# Patient Record
Sex: Male | Born: 1935 | Hispanic: Refuse to answer | Marital: Married
Health system: Southern US, Community
[De-identification: ages and names within clinical notes are randomized; demographics above are authoritative.]

---

## 2004-12-16 ENCOUNTER — Ambulatory Visit: Payer: Self-pay | Admitting: Internal Medicine

## 2005-02-15 ENCOUNTER — Encounter: Payer: Self-pay | Admitting: Internal Medicine

## 2005-02-17 ENCOUNTER — Encounter: Payer: Self-pay | Admitting: Internal Medicine

## 2005-03-20 ENCOUNTER — Encounter: Payer: Self-pay | Admitting: Internal Medicine

## 2005-10-31 ENCOUNTER — Other Ambulatory Visit: Payer: Self-pay

## 2005-10-31 ENCOUNTER — Emergency Department: Payer: Self-pay | Admitting: Unknown Physician Specialty

## 2006-01-03 ENCOUNTER — Ambulatory Visit: Payer: Self-pay | Admitting: Gastroenterology

## 2006-01-11 ENCOUNTER — Ambulatory Visit: Payer: Self-pay | Admitting: Gastroenterology

## 2007-01-25 ENCOUNTER — Ambulatory Visit: Payer: Self-pay | Admitting: Internal Medicine

## 2007-09-26 ENCOUNTER — Ambulatory Visit: Payer: Self-pay | Admitting: Ophthalmology

## 2008-02-15 ENCOUNTER — Ambulatory Visit: Payer: Self-pay | Admitting: Internal Medicine

## 2008-06-14 ENCOUNTER — Ambulatory Visit: Payer: Self-pay | Admitting: Gastroenterology

## 2008-07-23 ENCOUNTER — Ambulatory Visit: Payer: Self-pay | Admitting: Surgery

## 2008-07-29 ENCOUNTER — Ambulatory Visit: Payer: Self-pay | Admitting: Surgery

## 2008-07-30 ENCOUNTER — Emergency Department: Payer: Self-pay | Admitting: Emergency Medicine

## 2008-08-03 ENCOUNTER — Inpatient Hospital Stay: Payer: Self-pay | Admitting: Internal Medicine

## 2008-08-03 ENCOUNTER — Other Ambulatory Visit: Payer: Self-pay

## 2010-08-06 ENCOUNTER — Inpatient Hospital Stay: Payer: Self-pay | Admitting: Surgery

## 2010-08-11 LAB — PATHOLOGY REPORT

## 2010-08-17 ENCOUNTER — Ambulatory Visit: Payer: Self-pay | Admitting: Otolaryngology

## 2010-08-18 ENCOUNTER — Ambulatory Visit: Payer: Self-pay | Admitting: Cardiovascular Disease

## 2011-11-05 ENCOUNTER — Ambulatory Visit: Payer: Self-pay | Admitting: Urology

## 2012-04-07 ENCOUNTER — Ambulatory Visit: Payer: Self-pay | Admitting: Internal Medicine

## 2013-06-19 ENCOUNTER — Ambulatory Visit: Payer: Self-pay | Admitting: Internal Medicine

## 2013-07-16 ENCOUNTER — Observation Stay: Payer: Self-pay | Admitting: Internal Medicine

## 2013-07-16 DIAGNOSIS — R4182 Altered mental status, unspecified: Secondary | ICD-10-CM

## 2013-07-16 LAB — COMPREHENSIVE METABOLIC PANEL
Albumin: 3.3 g/dL — ABNORMAL LOW (ref 3.4–5.0)
Alkaline Phosphatase: 66 U/L (ref 50–136)
Anion Gap: 8 (ref 7–16)
Bilirubin,Total: 0.8 mg/dL (ref 0.2–1.0)
Calcium, Total: 8.8 mg/dL (ref 8.5–10.1)
Chloride: 104 mmol/L (ref 98–107)
Co2: 28 mmol/L (ref 21–32)
Creatinine: 1.11 mg/dL (ref 0.60–1.30)
EGFR (Non-African Amer.): 60 — ABNORMAL LOW
Osmolality: 279 (ref 275–301)
SGOT(AST): 58 U/L — ABNORMAL HIGH (ref 15–37)
Sodium: 140 mmol/L (ref 136–145)

## 2013-07-16 LAB — CBC WITH DIFFERENTIAL/PLATELET
Eosinophil #: 0.1 10*3/uL (ref 0.0–0.7)
Eosinophil %: 1.5 %
HGB: 11.5 g/dL — ABNORMAL LOW (ref 13.0–18.0)
Lymphocyte #: 1.8 10*3/uL (ref 1.0–3.6)
MCH: 30.4 pg (ref 26.0–34.0)
MCHC: 33.3 g/dL (ref 32.0–36.0)
MCV: 91 fL (ref 80–100)
Monocyte #: 0.7 x10 3/mm (ref 0.2–1.0)
Monocyte %: 7.4 %
Neutrophil #: 6.9 10*3/uL — ABNORMAL HIGH (ref 1.4–6.5)
Neutrophil %: 71.6 %
Platelet: 235 10*3/uL (ref 150–440)
RBC: 3.79 10*6/uL — ABNORMAL LOW (ref 4.40–5.90)
WBC: 9.6 10*3/uL (ref 3.8–10.6)

## 2013-07-16 LAB — CK-MB
CK-MB: 3.5 ng/mL (ref 0.5–3.6)
CK-MB: 2.9 ng/mL (ref 0.5–3.6)

## 2013-07-16 LAB — TROPONIN I: Troponin-I: <0.02 ng/mL

## 2013-07-16 LAB — SEDIMENTATION RATE: Erythrocyte Sed Rate: 22 mm/hr — ABNORMAL HIGH (ref 0–20)

## 2013-07-16 LAB — TSH: Thyroid Stimulating Horm: 1.36 u[IU]/mL

## 2013-07-17 LAB — URINALYSIS, COMPLETE
Bacteria: NONE SEEN
Bilirubin,UR: NEGATIVE
Blood: NEGATIVE
Leukocyte Esterase: NEGATIVE
Nitrite: NEGATIVE
Ph: 5 (ref 4.5–8.0)
RBC,UR: NONE SEEN /HPF (ref 0–5)
WBC UR: NONE SEEN /HPF (ref 0–5)

## 2013-07-17 LAB — HEPATIC FUNCTION PANEL A (ARMC)
Bilirubin,Total: 0.8 mg/dL (ref 0.2–1.0)
SGOT(AST): 37 U/L (ref 15–37)

## 2013-07-17 LAB — BASIC METABOLIC PANEL
Anion Gap: 6 — ABNORMAL LOW (ref 7–16)
Calcium, Total: 8.2 mg/dL — ABNORMAL LOW (ref 8.5–10.1)
Chloride: 107 mmol/L (ref 98–107)
Co2: 27 mmol/L (ref 21–32)
Glucose: 87 mg/dL (ref 65–99)
Osmolality: 278 (ref 275–301)
Potassium: 3.7 mmol/L (ref 3.5–5.1)
Sodium: 140 mmol/L (ref 136–145)

## 2013-07-17 LAB — CBC WITH DIFFERENTIAL/PLATELET
Eosinophil #: 0.2 10*3/uL (ref 0.0–0.7)
Eosinophil %: 2.4 %
HCT: 32.5 % — ABNORMAL LOW (ref 40.0–52.0)
HGB: 11.1 g/dL — ABNORMAL LOW (ref 13.0–18.0)
Lymphocyte #: 2.1 10*3/uL (ref 1.0–3.6)
MCHC: 34.3 g/dL (ref 32.0–36.0)
MCV: 92 fL (ref 80–100)
Monocyte #: 0.7 x10 3/mm (ref 0.2–1.0)
Monocyte %: 9.9 %
Neutrophil %: 58.7 %
RBC: 3.54 10*6/uL — ABNORMAL LOW (ref 4.40–5.90)
RDW: 14.3 % (ref 11.5–14.5)
WBC: 7.5 10*3/uL (ref 3.8–10.6)

## 2013-07-18 LAB — URINE CULTURE

## 2013-07-20 ENCOUNTER — Ambulatory Visit: Payer: Self-pay | Admitting: Nurse Practitioner

## 2013-09-01 ENCOUNTER — Inpatient Hospital Stay: Payer: Self-pay | Admitting: Internal Medicine

## 2013-09-01 LAB — URINALYSIS, COMPLETE
Bacteria: NONE SEEN
Bilirubin,UR: NEGATIVE
Glucose,UR: NEGATIVE mg/dL (ref 0–75)
Hyaline Cast: 3
Leukocyte Esterase: NEGATIVE
Nitrite: NEGATIVE
Protein: NEGATIVE

## 2013-09-01 LAB — COMPREHENSIVE METABOLIC PANEL
Albumin: 3.7 g/dL (ref 3.4–5.0)
Anion Gap: 6 — ABNORMAL LOW (ref 7–16)
Bilirubin,Total: 1 mg/dL (ref 0.2–1.0)
Calcium, Total: 8.9 mg/dL (ref 8.5–10.1)
Chloride: 104 mmol/L (ref 98–107)
Co2: 26 mmol/L (ref 21–32)
EGFR (African American): >60
EGFR (Non-African Amer.): >60
Glucose: 136 mg/dL — ABNORMAL HIGH (ref 65–99)
SGOT(AST): 35 U/L (ref 15–37)
SGPT (ALT): 18 U/L (ref 12–78)
Sodium: 136 mmol/L (ref 136–145)

## 2013-09-01 LAB — CBC
HGB: 13.2 g/dL (ref 13.0–18.0)
Platelet: 232 10*3/uL (ref 150–440)
RBC: 4.29 10*6/uL — ABNORMAL LOW (ref 4.40–5.90)
RDW: 14.5 % (ref 11.5–14.5)
WBC: 17 10*3/uL — ABNORMAL HIGH (ref 3.8–10.6)

## 2013-09-02 LAB — MAGNESIUM: Magnesium: 1.7 mg/dL — ABNORMAL LOW

## 2013-09-02 LAB — BASIC METABOLIC PANEL
BUN: 9 mg/dL (ref 7–18)
Calcium, Total: 8 mg/dL — ABNORMAL LOW (ref 8.5–10.1)
EGFR (African American): >60
Glucose: 79 mg/dL (ref 65–99)
Potassium: 3.3 mmol/L — ABNORMAL LOW (ref 3.5–5.1)
Sodium: 139 mmol/L (ref 136–145)

## 2013-09-02 LAB — CBC WITH DIFFERENTIAL/PLATELET
Basophil #: 0.1 10*3/uL (ref 0.0–0.1)
Eosinophil %: 1.3 %
HGB: 11.4 g/dL — ABNORMAL LOW (ref 13.0–18.0)
Lymphocyte %: 18.4 %
MCH: 31.5 pg (ref 26.0–34.0)
MCHC: 33.9 g/dL (ref 32.0–36.0)
MCV: 93 fL (ref 80–100)
Monocyte %: 8.5 %
Neutrophil #: 7.8 10*3/uL — ABNORMAL HIGH (ref 1.4–6.5)
Neutrophil %: 71.2 %
Platelet: 211 10*3/uL (ref 150–440)
RDW: 14.9 % — ABNORMAL HIGH (ref 11.5–14.5)

## 2013-09-02 LAB — TSH: Thyroid Stimulating Horm: 0.833 u[IU]/mL

## 2013-09-03 LAB — BASIC METABOLIC PANEL
Anion Gap: 7 (ref 7–16)
Calcium, Total: 8.4 mg/dL — ABNORMAL LOW (ref 8.5–10.1)
Co2: 24 mmol/L (ref 21–32)
EGFR (African American): >60
EGFR (Non-African Amer.): >60
Sodium: 141 mmol/L (ref 136–145)

## 2013-09-03 LAB — URINE CULTURE

## 2013-09-04 LAB — CBC WITH DIFFERENTIAL/PLATELET
Basophil #: 0.1 10*3/uL (ref 0.0–0.1)
HCT: 36.7 % — ABNORMAL LOW (ref 40.0–52.0)
HGB: 12.5 g/dL — ABNORMAL LOW (ref 13.0–18.0)
Lymphocyte #: 3.4 10*3/uL (ref 1.0–3.6)
Monocyte #: 0.8 x10 3/mm (ref 0.2–1.0)
Monocyte %: 9.2 %
Neutrophil %: 48.9 %
Platelet: 249 10*3/uL (ref 150–440)
WBC: 8.9 10*3/uL (ref 3.8–10.6)

## 2013-09-06 LAB — BASIC METABOLIC PANEL
Anion Gap: 8 (ref 7–16)
Calcium, Total: 8.8 mg/dL (ref 8.5–10.1)
Chloride: 106 mmol/L (ref 98–107)
Co2: 23 mmol/L (ref 21–32)
Creatinine: 1.12 mg/dL (ref 0.60–1.30)
Glucose: 82 mg/dL (ref 65–99)

## 2013-09-06 LAB — CBC WITH DIFFERENTIAL/PLATELET
Basophil #: 0.1 10*3/uL (ref 0.0–0.1)
Basophil %: 1.6 %
Eosinophil #: 0.3 10*3/uL (ref 0.0–0.7)
HCT: 36.2 % — ABNORMAL LOW (ref 40.0–52.0)
HGB: 12.4 g/dL — ABNORMAL LOW (ref 13.0–18.0)
Lymphocyte %: 26.8 %
MCH: 31.9 pg (ref 26.0–34.0)
MCHC: 34.1 g/dL (ref 32.0–36.0)
MCV: 93 fL (ref 80–100)
Monocyte %: 7.4 %
Neutrophil #: 5.1 10*3/uL (ref 1.4–6.5)
Neutrophil %: 60.4 %
Platelet: 269 10*3/uL (ref 150–440)
RBC: 3.88 10*6/uL — ABNORMAL LOW (ref 4.40–5.90)
WBC: 8.5 10*3/uL (ref 3.8–10.6)

## 2013-09-18 ENCOUNTER — Inpatient Hospital Stay: Payer: Self-pay | Admitting: Internal Medicine

## 2013-09-18 LAB — CBC WITH DIFFERENTIAL/PLATELET
Basophil #: 0.1 10*3/uL (ref 0.0–0.1)
Basophil %: 1.2 %
Eosinophil #: 0.1 10*3/uL (ref 0.0–0.7)
Eosinophil %: 1.7 %
Lymphocyte #: 1.5 10*3/uL (ref 1.0–3.6)
Lymphocyte %: 17.3 %
MCHC: 33.6 g/dL (ref 32.0–36.0)
MCV: 94 fL (ref 80–100)
Monocyte #: 0.5 x10 3/mm (ref 0.2–1.0)
Monocyte %: 6.3 %
Neutrophil #: 6.3 10*3/uL (ref 1.4–6.5)
Neutrophil %: 73.5 %
Platelet: 290 10*3/uL (ref 150–440)
RBC: 4.37 10*6/uL — ABNORMAL LOW (ref 4.40–5.90)
RDW: 14.5 % (ref 11.5–14.5)
WBC: 8.6 10*3/uL (ref 3.8–10.6)

## 2013-09-18 LAB — COMPREHENSIVE METABOLIC PANEL
Alkaline Phosphatase: 156 U/L — ABNORMAL HIGH (ref 50–136)
BUN: 15 mg/dL (ref 7–18)
Chloride: 104 mmol/L (ref 98–107)
EGFR (Non-African Amer.): 45 — ABNORMAL LOW
Glucose: 119 mg/dL — ABNORMAL HIGH (ref 65–99)
Potassium: 4 mmol/L (ref 3.5–5.1)
SGOT(AST): 174 U/L — ABNORMAL HIGH (ref 15–37)
SGPT (ALT): 177 U/L — ABNORMAL HIGH (ref 12–78)

## 2013-09-18 LAB — TROPONIN I: Troponin-I: <0.02 ng/mL

## 2013-09-19 LAB — COMPREHENSIVE METABOLIC PANEL
Albumin: 3.4 g/dL (ref 3.4–5.0)
Alkaline Phosphatase: 132 U/L (ref 50–136)
Anion Gap: 7 (ref 7–16)
BUN: 13 mg/dL (ref 7–18)
Bilirubin,Total: 0.7 mg/dL (ref 0.2–1.0)
Calcium, Total: 9 mg/dL (ref 8.5–10.1)
Chloride: 103 mmol/L (ref 98–107)
Co2: 25 mmol/L (ref 21–32)
Creatinine: 1.14 mg/dL (ref 0.60–1.30)
EGFR (African American): >60
EGFR (Non-African Amer.): 58 — ABNORMAL LOW
Glucose: 118 mg/dL — ABNORMAL HIGH (ref 65–99)
Osmolality: 271 (ref 275–301)
Potassium: 4.1 mmol/L (ref 3.5–5.1)
SGOT(AST): 72 U/L — ABNORMAL HIGH (ref 15–37)
SGPT (ALT): 127 U/L — ABNORMAL HIGH (ref 12–78)
Sodium: 135 mmol/L — ABNORMAL LOW (ref 136–145)
Total Protein: 6.6 g/dL (ref 6.4–8.2)

## 2013-09-19 LAB — URINALYSIS, COMPLETE
Bacteria: NONE SEEN
Bilirubin,UR: NEGATIVE
Blood: NEGATIVE
Glucose,UR: NEGATIVE mg/dL (ref 0–75)
Ketone: NEGATIVE
Leukocyte Esterase: NEGATIVE
Nitrite: NEGATIVE
Ph: 7 (ref 4.5–8.0)
Protein: NEGATIVE
RBC,UR: NONE SEEN /HPF (ref 0–5)
Specific Gravity: 1.008 (ref 1.003–1.030)
Squamous Epithelial: NONE SEEN
WBC UR: <1 /HPF (ref 0–5)

## 2013-09-20 LAB — COMPREHENSIVE METABOLIC PANEL
Albumin: 3.2 g/dL — ABNORMAL LOW (ref 3.4–5.0)
Anion Gap: 6 — ABNORMAL LOW (ref 7–16)
BUN: 10 mg/dL (ref 7–18)
Calcium, Total: 8.8 mg/dL (ref 8.5–10.1)
Chloride: 107 mmol/L (ref 98–107)
Creatinine: 1 mg/dL (ref 0.60–1.30)
Osmolality: 272 (ref 275–301)
Potassium: 3.6 mmol/L (ref 3.5–5.1)
SGPT (ALT): 93 U/L — ABNORMAL HIGH (ref 12–78)

## 2013-09-20 LAB — CBC WITH DIFFERENTIAL/PLATELET
Basophil #: 0.1 10*3/uL (ref 0.0–0.1)
Eosinophil #: 0.3 10*3/uL (ref 0.0–0.7)
Eosinophil %: 2.7 %
HCT: 37.7 % — ABNORMAL LOW (ref 40.0–52.0)
HGB: 12.8 g/dL — ABNORMAL LOW (ref 13.0–18.0)
Lymphocyte #: 1.6 10*3/uL (ref 1.0–3.6)
MCH: 31.7 pg (ref 26.0–34.0)
MCV: 93 fL (ref 80–100)
Monocyte %: 7.9 %
Neutrophil %: 73.6 %
RDW: 14.7 % — ABNORMAL HIGH (ref 11.5–14.5)

## 2013-09-21 LAB — COMPREHENSIVE METABOLIC PANEL
Albumin: 3.2 g/dL — ABNORMAL LOW (ref 3.4–5.0)
Alkaline Phosphatase: 106 U/L (ref 50–136)
Anion Gap: 8 (ref 7–16)
BUN: 10 mg/dL (ref 7–18)
Bilirubin,Total: 1 mg/dL (ref 0.2–1.0)
Calcium, Total: 8.9 mg/dL (ref 8.5–10.1)
Chloride: 106 mmol/L (ref 98–107)
Co2: 22 mmol/L (ref 21–32)
Creatinine: 0.98 mg/dL (ref 0.60–1.30)
EGFR (African American): >60
Potassium: 3.5 mmol/L (ref 3.5–5.1)
SGOT(AST): 28 U/L (ref 15–37)
Sodium: 136 mmol/L (ref 136–145)
Total Protein: 6.4 g/dL (ref 6.4–8.2)

## 2013-09-23 DIAGNOSIS — R109 Unspecified abdominal pain: Secondary | ICD-10-CM

## 2013-09-23 LAB — PLATELET COUNT: Platelet: 235 10*3/uL (ref 150–440)

## 2013-09-23 LAB — COMPREHENSIVE METABOLIC PANEL
Albumin: 3.1 g/dL — ABNORMAL LOW (ref 3.4–5.0)
Anion Gap: 8 (ref 7–16)
Bilirubin,Total: 0.8 mg/dL (ref 0.2–1.0)
Calcium, Total: 8.9 mg/dL (ref 8.5–10.1)
Chloride: 107 mmol/L (ref 98–107)
Creatinine: 1.16 mg/dL (ref 0.60–1.30)
EGFR (Non-African Amer.): 57 — ABNORMAL LOW
Osmolality: 278 (ref 275–301)
Potassium: 3.2 mmol/L — ABNORMAL LOW (ref 3.5–5.1)
SGPT (ALT): 48 U/L (ref 12–78)
Total Protein: 6.3 g/dL — ABNORMAL LOW (ref 6.4–8.2)

## 2013-09-24 LAB — POTASSIUM: Potassium: 3.8 mmol/L (ref 3.5–5.1)

## 2013-09-25 LAB — TROPONIN I: Troponin-I: <0.02 ng/mL

## 2013-11-19 DEATH — deceased

## 2014-12-25 IMAGING — CR RIGHT ELBOW - 2 VIEW
1 series · 2 of 2 positions shown · non-contrast
Comparison: none

REASON FOR EXAM: fall injury
COMMENTS:

PROCEDURE:     DXR - DXR ELBOW RT AP AND LATERAL  - September 01, 2013  [DATE]
RESULT:     There is no evidence of fracture, dislocation, or malalignment.

[Series 1: ap · 0.17mm/px · 2 of 2 slices shown]
[im 1/2]
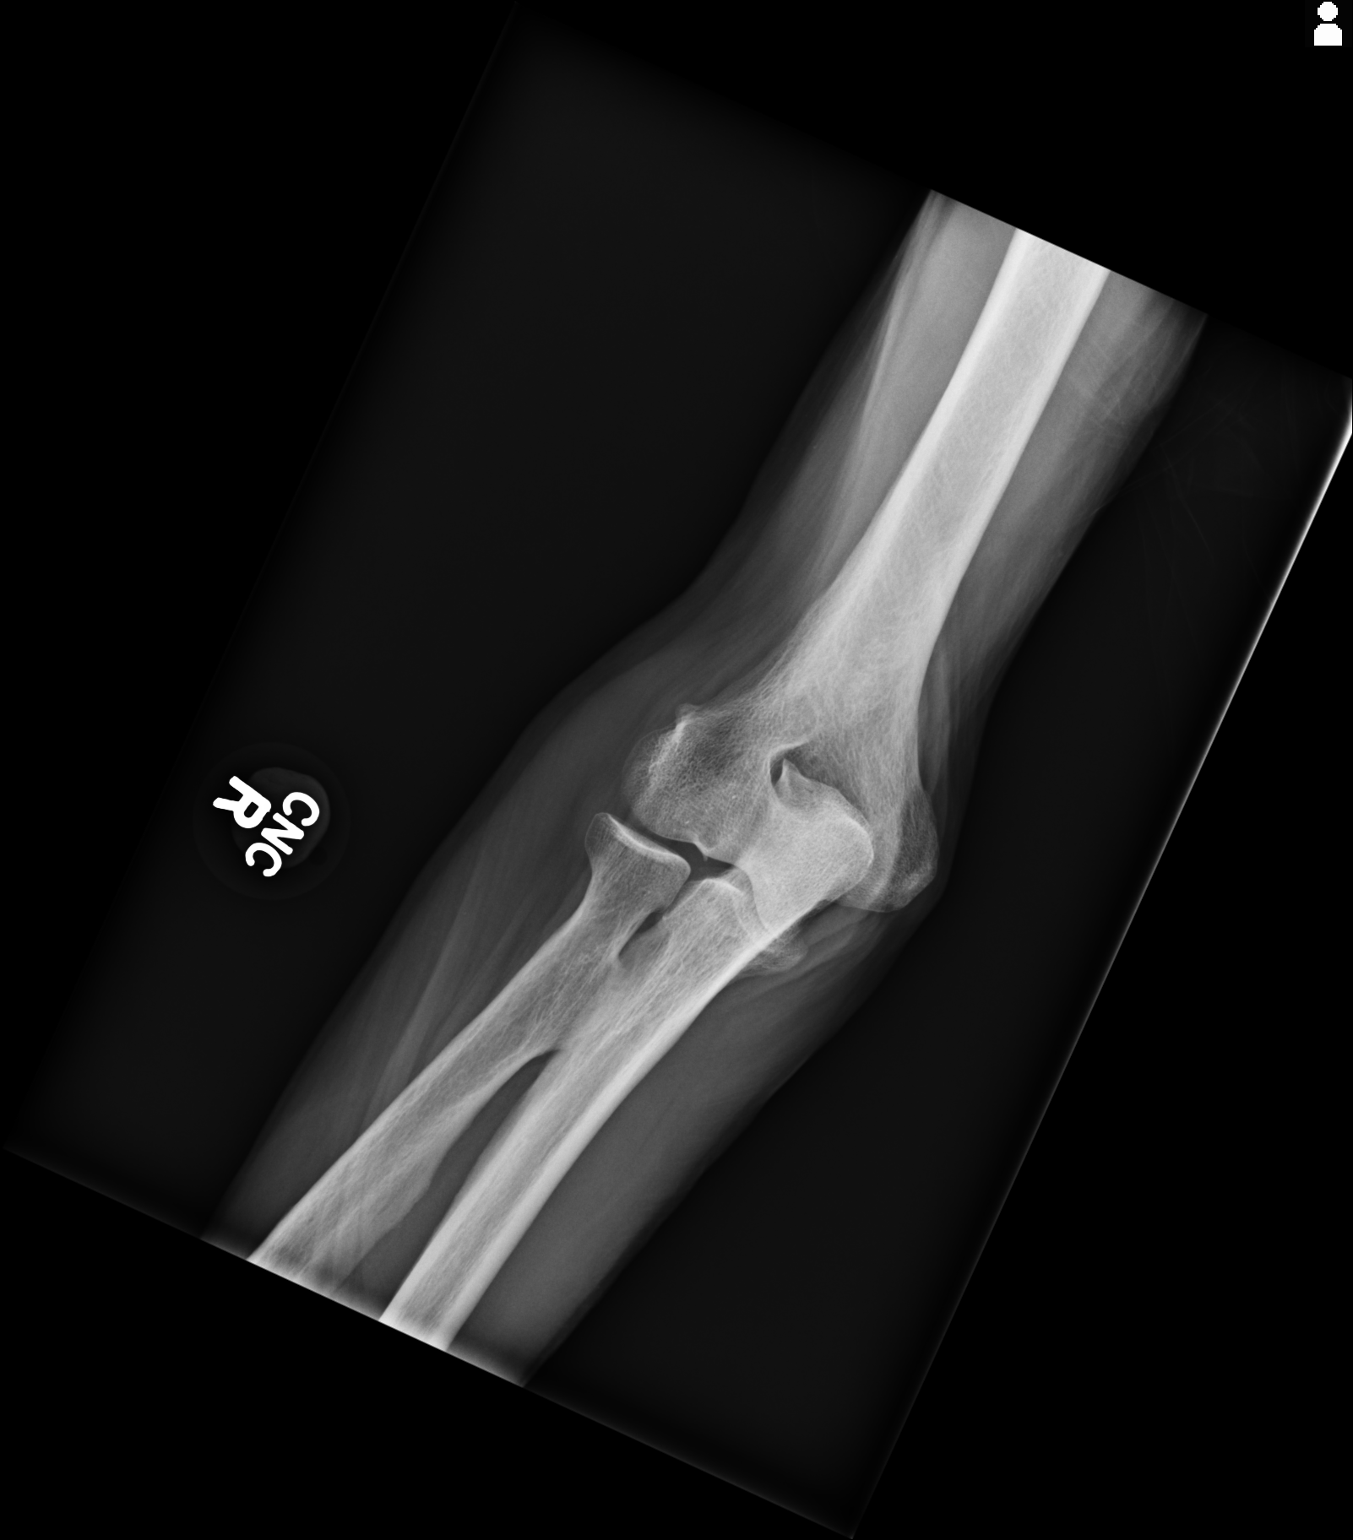
[im 2/2]
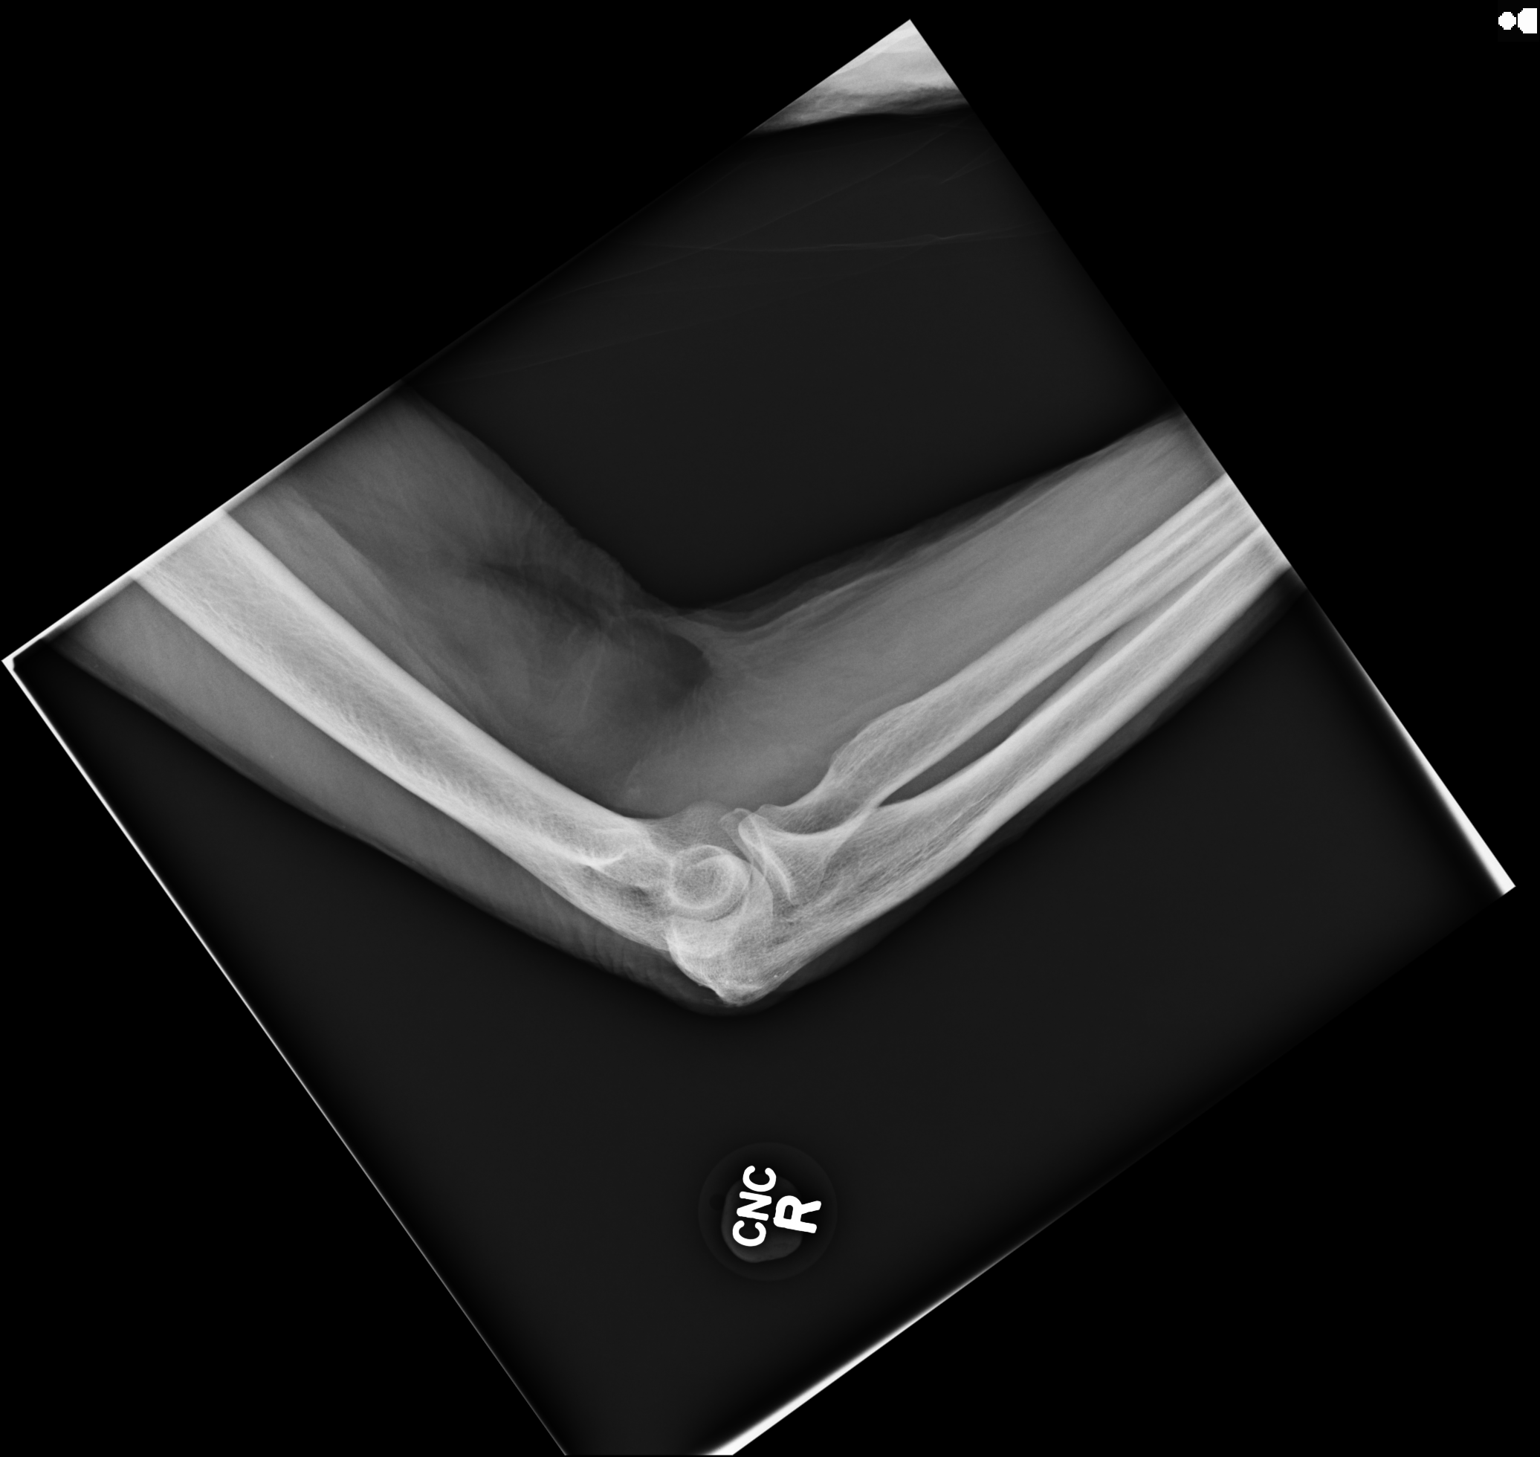

[2 of 2 positions shown; findings below may reference images not displayed]

IMPRESSION: 1. No evidence of acute abnormalities.
2. If there are persistent complaints of pain or persistent clinical
concern, a repeat evaluation in 7-10 days is recommended if clinically
warranted.

## 2014-12-25 IMAGING — CR DG CHEST 2V
1 series · 3 of 3 positions shown · non-contrast
Comparison: none

REASON FOR EXAM: weakness, small
COMMENTS:

PROCEDURE:     DXR - DXR CHEST PA (OR AP) AND LATERAL  - September 01, 2013  [DATE]
RESULT:     Comparison is made to prior study dated 07/16/2013.

[Series 1: ap · 0.17mm/px · 3 of 3 slices shown]
[im 1/3]
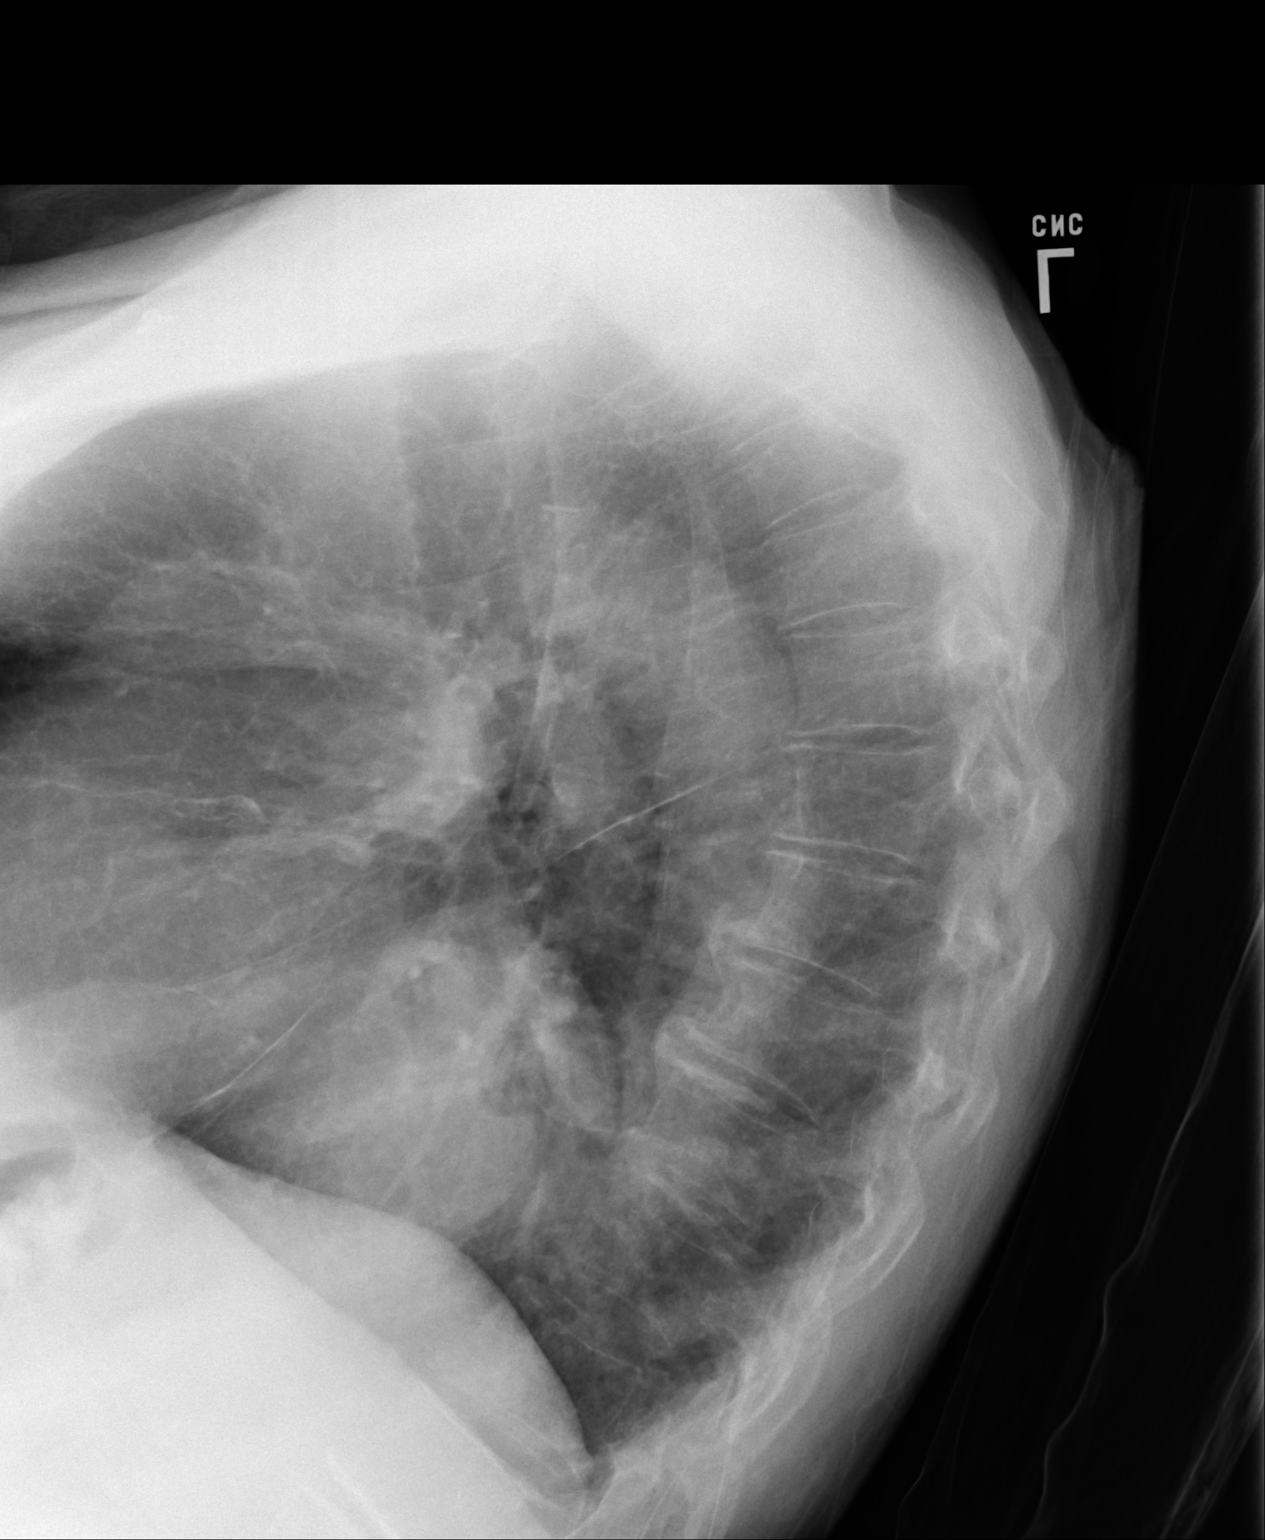
[im 2/3]
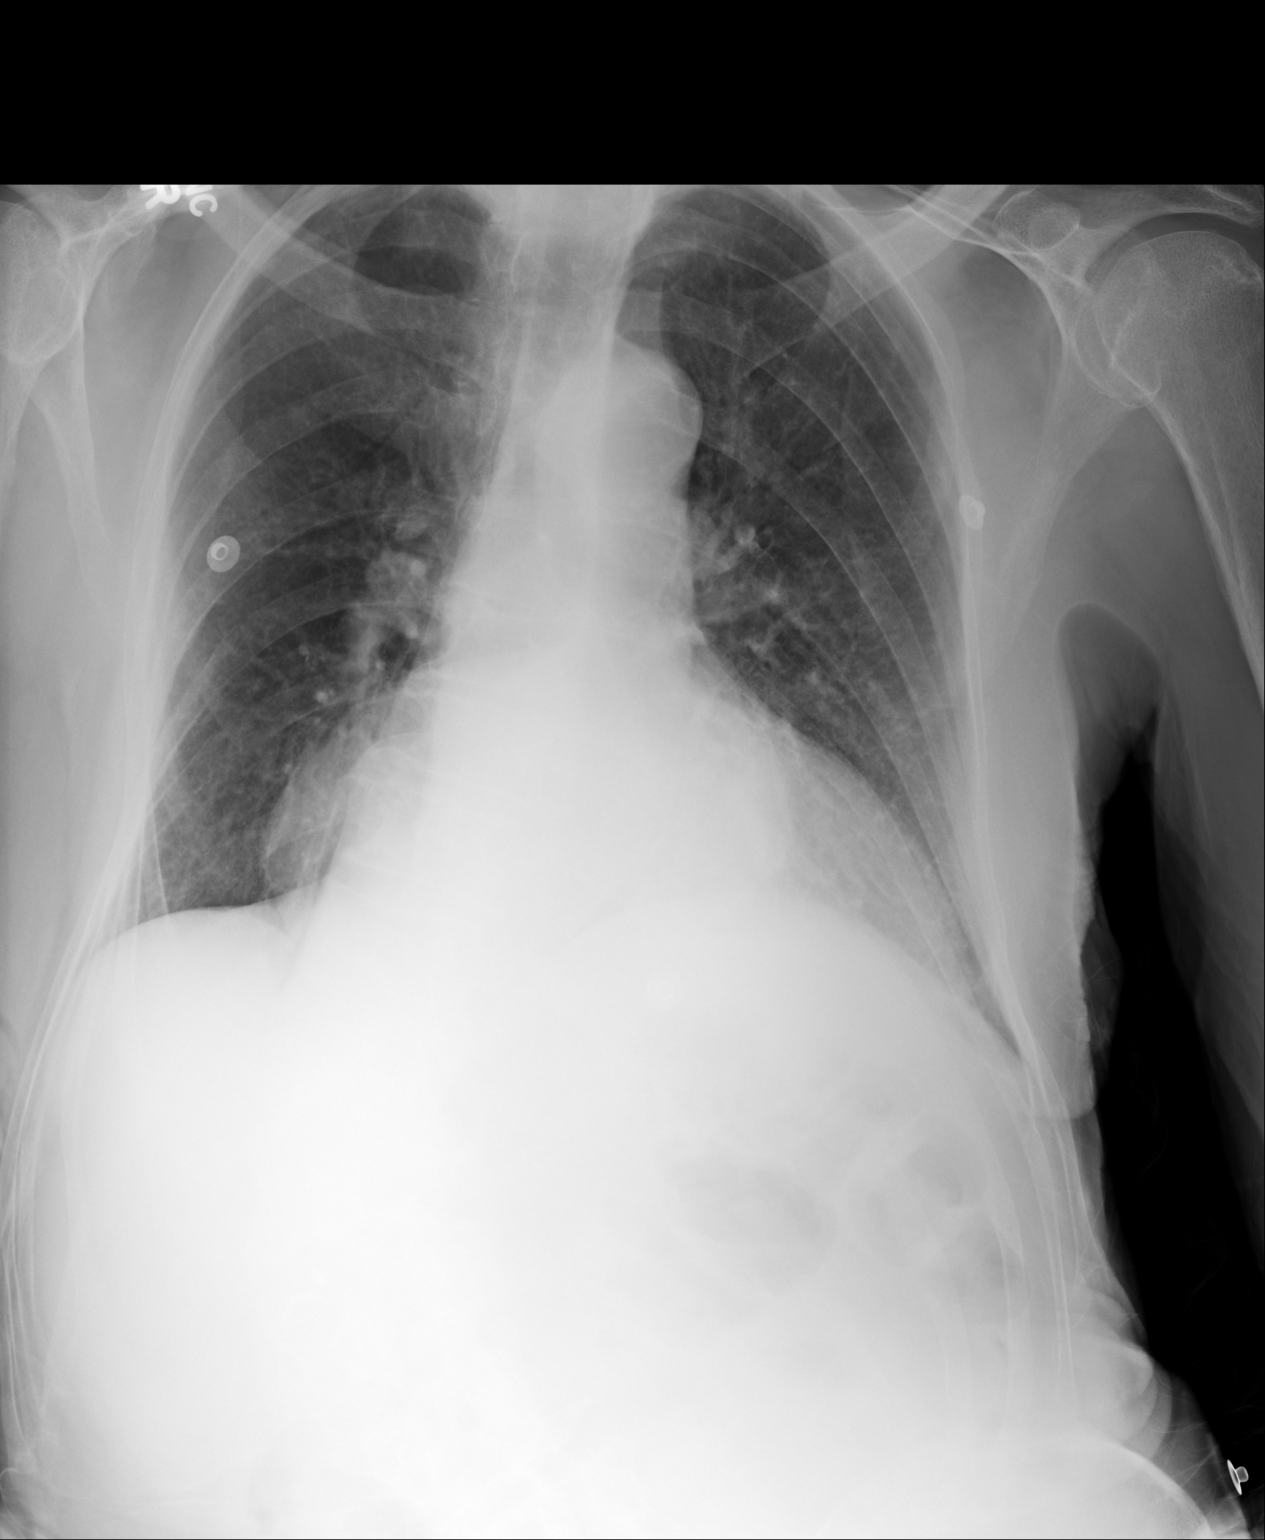
[im 3/3]
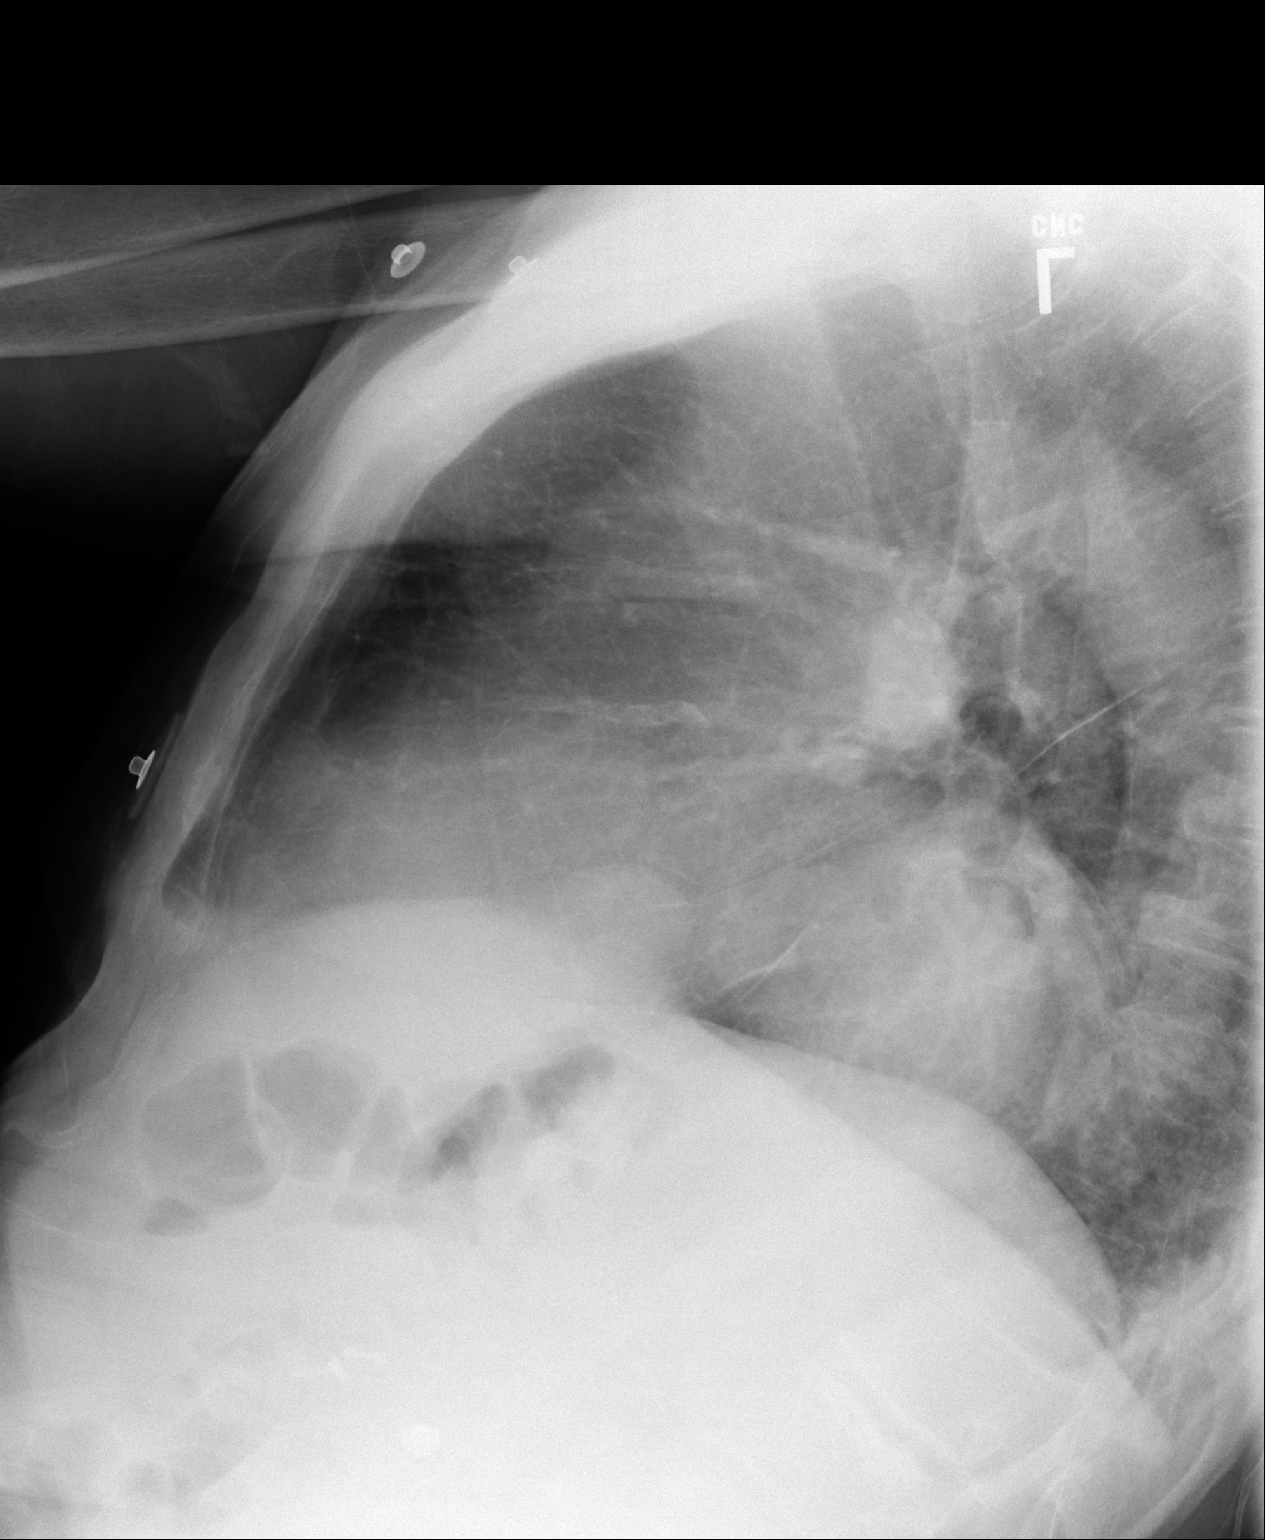

[3 of 3 positions shown; findings below may reference images not displayed]

FINDINGS: There is no evidence of focal infiltrates, effusions or edema. The cardiac
silhouette is enlarged indicative of cardiomegaly. The visualized bony
skeleton is unremarkable.
IMPRESSION: 1. Chest radiograph without evidence of acute cardiopulmonary disease.

## 2015-01-18 IMAGING — NM NM MYOCARDIAL SCAN - [facility]
8 series · 48 of 48 positions shown · non-contrast
Comparison: none

REASON FOR EXAM: dyspnea/chest pain
COMMENTS:

[Series 1000: rest (recon - noac ) · 4.8mm · 4.80mm/px · 6 of 34 frames shown]
[frame 3/34]
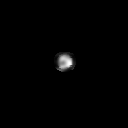
[frame 9/34]
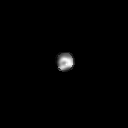
[frame 15/34]
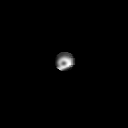
[frame 20/34]
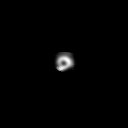
[frame 26/34]
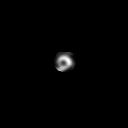
[frame 32/34]
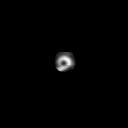

[Series 1000: stress (recon - ac ) · 4.8mm · 4.80mm/px · 6 of 33 frames shown]
[frame 3/33]
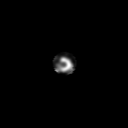
[frame 9/33]
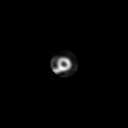
[frame 14/33]
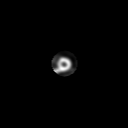
[frame 20/33]
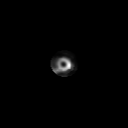
[frame 25/33]
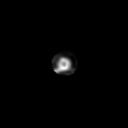
[frame 31/33]
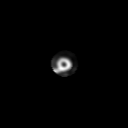

[Series 1000: rest (recon - ac ) · 4.8mm · 4.80mm/px · 6 of 28 frames shown]
[frame 3/28]
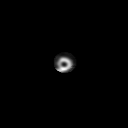
[frame 7/28]
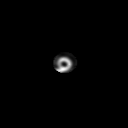
[frame 12/28]
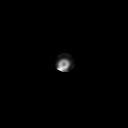
[frame 17/28]
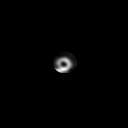
[frame 21/28]
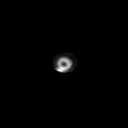
[frame 26/28]
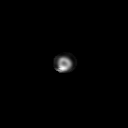

[Series 1000: stress-gated (recon) · 4.8mm · 4.80mm/px · 6 of 240 frames shown]
[frame 21/240]
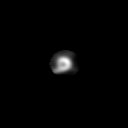
[frame 61/240]
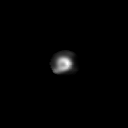
[frame 101/240]
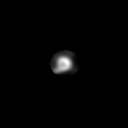
[frame 141/240]
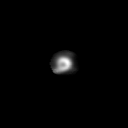
[frame 181/240]
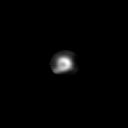
[frame 221/240]
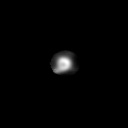

[Series 1000: rest_dc · 4.80mm/px · 6 of 68 frames shown]
[frame 6/68]
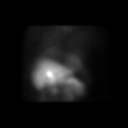
[frame 17/68]
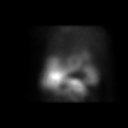
[frame 29/68]
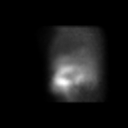
[frame 40/68]
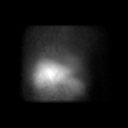
[frame 51/68]
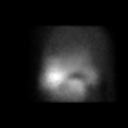
[frame 63/68]
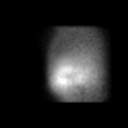

[Series 1000: stress (recon - noac ) · 4.8mm · 4.80mm/px · 6 of 34 frames shown]
[frame 3/34]
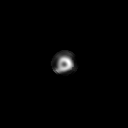
[frame 9/34]
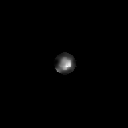
[frame 15/34]
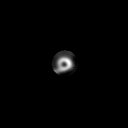
[frame 20/34]
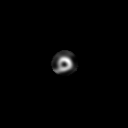
[frame 26/34]
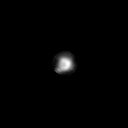
[frame 32/34]
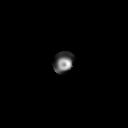

[Series 1000: stress-gated_dc · 4.80mm/px · 6 of 544 frames shown]
[frame 46/544]
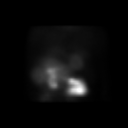
[frame 136/544]
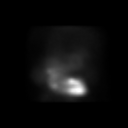
[frame 227/544]
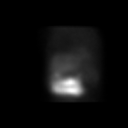
[frame 318/544]
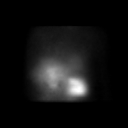
[frame 408/544]
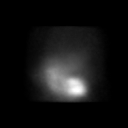
[frame 499/544]
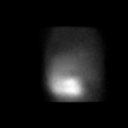

[Series 1000: stress_dc · 4.80mm/px · 6 of 68 frames shown]
[frame 6/68]
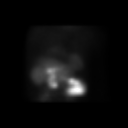
[frame 17/68]
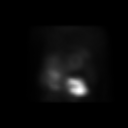
[frame 29/68]
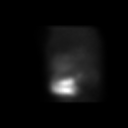
[frame 40/68]
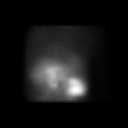
[frame 51/68]
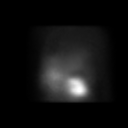
[frame 63/68]
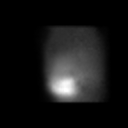

[48 of 48 positions shown; findings below may reference images not displayed]

PROCEDURE:     NM  - NM MYOCARDIAL SCAN  - [DATE]  [DATE] [DATE]  [DATE]

RESULT:     Nuclear Cardiology Report

Patient Demographics
       Name: PULENTO MALPU   Study Date: 09/25/2013
        DOB: 01/31/1925 Age: 89 years             Height:
  Ordering Physician: LANEAU JAMESON
Indications
The patient was imaged for the following indications:
Assessment of acute chest pain.

Clinical History
[age] year old M with no known coronary artery disease.
Cardiac risk factors include: hypertension and diabetes.
Images were obtained using Rest Xc-55m/stress Xc-55m 1 day protocol.

Procedure
Regadenosine The test was terminated due to End of Protocol. The heart rate
was 53 beats per minute at rest and increased to 83 beats at peak exercise
The resting blood pressure was 122/60 mm/Hg and the stress blood pressure
was 126/61mm/Hg.
Myocardial perfusion imaging was performed following the injection of
MCi of VVm4c Sestamibi at [DATE].
The patient was injected with 29.66 MCi of VVm4c Sestamibi at [DATE] for the
stress portion of the exam.

Stress Test Findings

The following table summarizes the findings:
+-------------+-------------------+-------------------+
               STRESS EKG DATA     REST EKG DATA
+-------------+-------------------+-------------------+
Test Status  Normal             Normal
+-------------+-------------------+-------------------+
Rhythm       Normal sinus rhythmNormal sinus rhythm
+-------------+-------------------+-------------------+
IV ConductionNormal             Normal
+-------------+-------------------+-------------------+
Arrhythmias                     None
+-------------+-------------------+-------------------+
ST Response  Normal
+-------------+-------------------+-------------------+

Overall Impression
The overall study imaging quality was deemed to be good.
The left ventricular global function was normal.
This myocardial perfusion scan showed no evidence of pathology and has a
normal appearance. The stress to rest volume ratio is 0.89 for the left
ventricle.
Pharmacological myocardial perfusion study with no significant ischemia.
There is no artifact noted on this study.
No significant wall motion abnormality noted.
The estimated ejection fraction is 57%.
There are no EKG changes concerning for ischemia.
Overall, this is a Low risk scan.

Summary
 1. No significant wall motion abnormality noted.
 2. Overall, this is a Low risk scan.
 3. Pharmacological myocardial perfusion study with no significant ischemia.
 4. The estimated ejection fraction is 57%.
 5. The left ventricular global function was normal.
 6. There are no EKG changes concerning for ischemia.
 7. There is no artifact noted on this study.

*** Final ***
IMPRESSION: .

## 2015-04-11 NOTE — Discharge Summary (Signed)
PATIENT NAME:  Douglas Carlson, MIKITA MR#:  [id number] DATE OF BIRTH:  03/30/1925  DATE OF ADMISSION:  07/16/2013 DATE OF DISCHARGE:  07/19/2013  FINAL DIAGNOSES: 1.  Altered mental status.  2.  Dehydration.  3.  Fall with left side head contusion, possibly as cause of altered mental status secondary to concussion syndrome.  4.  Severe depression and anxiety. 5.  Severe degenerative joint disease and degenerative disk disease causing chronic pain syndrome.  6.  Protein calorie malnutrition.  7.  Gastroesophageal reflux disease.  8.  Benign prostatic hypertrophy with urinary hesitancy, chronic. Previously evaluated by urology with surgical intervention recommended, which the patient has declined to pursue.  9.  Valvular heart disease.  10.  Hiatal hernia with previous surgical evaluation recommending against operative repair secondary to the patient being high risk.  11.  Chronic left elbow bursitis.  12.  Appendectomy.  13.  Status post vasectomy.  14.  Status post cholecystectomy.   HISTORY AND PHYSICAL: Please see dictated admission history and physical.   HOSPITAL COURSE: The patient was admitted with increasing confusion. He had evidence of falls, with trauma to the left side of his head. He complained of hip pain and back pain. He underwent lumbar spine films, bilateral hip films, and CT of the head, which showed no evidence of fracture or intracranial abnormality. He was able to ambulate physical therapy, with some soft tissue pain and it was thought that this was likely the source of his continued hip pain. His back pain has been a chronic issue, and adjustment of his medications were continued to be attempted.   He was found to have evidence of dehydration on arrival and he was treated with IV fluids, with resolution of this. Oral intake was encouraged, and supplements were added.   He has had a long history of trouble with urination, however, he had no evidence of urinary tract  infection. His prevoid residual was about 350 mL, and he was able to void about 150 mL after this, and was resumed on Rapaflo, medication which has given some relief in the past, and with which he has been somewhat compliant. He has previously been evaluated by urology who had recommended potential prostate procedure; however, the patient elects against this.   Psychiatry was asked to see the patient due to his severe depression and anxiety. He had been seen by psychiatry in the past, with some improvement in his somatic symptoms once his depression was improved. They felt that he was not able to understand his current situation. Medications were adjusted to try to help with his depression and anxiety.   It was clear from the patient's physical examination as well as from description of his home situation that he was not able to return to the same home situation and so rehabilitation was recommended initially, with anticipation in transitioning to some sort of assisted living facility or senior apartment. All of this was not entirely what the patient would prefer, although he was willing to entertain this. His family members were involved in this discussion and this was their desire as well. To this end, the patient will be discharged to skilled nursing facility in stable condition with his physical activity up with a rolling walker with assistance as tolerated. He will follow up with the nursing home physician and we will try to get him to psychiatry as an outpatient in the next 2 to 4 weeks. We will maintain his diet as a regular diet rather than  no added salt secondary to his poor oral intake and he will try to drink 1 can of Ensure daily. If his blood pressure becomes to be more of an issue, sodium could be restricted. He did show evidence of some bradycardia here which resolved with stopping Lopressor. Physical therapy should evaluate and treat the patient.   DISCHARGE MEDICATIONS: 1.  Neurontin 200 mg  p.o. b.i.d.  2.  Melatonin 5 mg p.o. at bedtime.  3.  Vitamin D 400 units p.o. daily.  4.  Aggrenox one p.o. b.i.d. for history of TIA.  5.  Rapaflo 4 mg p.o. daily.  6.  Oxycodone ER 15 mg p.o. b.i.d.  7.  Norco 5/325 mg 1 p.o. q. 4 hours as needed for severe pain.  8.  Bupropion 75 mg p.o. q. a.m.  9.  Mirtazapine 15 mg p.o. at bedtime.  10.  Trazodone 25 mg p.o. at bedtime. 11.  Xanax 0.25 mg p.o. b.i.d.  12.  Dulcolax 10 mg suppository rectally daily as needed for constipation.  13.  Colace 100 mg p.o. b.i.d.  14.  MiraLax 17 grams p.o. daily.  15.  Pantoprazole 40 mg p.o. daily.  16.  Multivitamin 1 p.o. daily.   CODE STATUS: The patient was FULL CODE during his hospitalization. This was addressed with him with palliative care; however, this remains his wish. Family members wish respect this.  TIME SPENT:  45 minutes.  ____________________________ Lynnea FerrierBert J. Klein III, MD [email or web address] D: 07/19/2013 12:37:22 ET T: 07/19/2013 12:50:43 ET JOB#: [id number]  cc: Lynnea FerrierBert J. Klein III, MD, <Dictator> Daniel NonesBERT KLEIN MD ELECTRONICALLY SIGNED 07/23/2013 7:53

## 2015-04-11 NOTE — Consult Note (Signed)
Chief Complaint:  Subjective/Chief Complaint Please see full GI consult and brief consult note.  Patient seen and examined, chart reviewed.  Consult for abdominal pain.  CT showing largely intrathoracic stomach distended with food material and fluid.  Currently doing much better after NGT decompression, currently without pain.   Concern for gastric outlet obstruction, DDx including PUDz, neoplasia as well as mechanical due to hiatal hernic/distal compression.  Recommend EGD when clinically with anesthesia assistance.  Was on aggranox, this now stopped, recommend to continue to hold and will Plan EGD for friday.  Following, thank you for this consult.   VITAL SIGNS/ANCILLARY NOTES: **Vital Signs.:   01-Oct-14 20:11  Vital Signs Type Routine  Temperature Temperature (F) 98  Celsius 36.6  Temperature Source oral  Respirations Respirations 20  Systolic BP Systolic BP 135  Diastolic BP (mmHg) Diastolic BP (mmHg) 87  Mean BP 103  Pulse Ox % Pulse Ox % 93  Pulse Ox Activity Level  At rest  Oxygen Delivery Room Air/ 21 %   Electronic Signatures: Barnetta ChapelSkulskie, Kolina Kube (MD)  (Signed 01-Oct-14 21:04)  Authored: Chief Complaint, VITAL SIGNS/ANCILLARY NOTES   Last Updated: 01-Oct-14 21:04 by Barnetta ChapelSkulskie, Yulanda Diggs (MD)

## 2015-04-11 NOTE — Consult Note (Signed)
Referring Physician:  Tama High :   Primary Care Physician:  James Ivanoff, [facility] : [facility], [address], [place], [place] [address], [place] [phone]  Reason for Consult: Admit Date: 01-Sep-2013  Chief Complaint: progressive memory loss, falls  Reason for Consult: memory loss, falls   History of Present Illness: History of Present Illness:   81 year old man presents with symptoms of worsening falls recently.  There is concern that he is also suffering from memory loss and may not be able to adequately take care of himself according to his family today.  He lives alone and does his own meals and medication management.  He is not interested in a retirement community.  Upon presentation he had some slurred speech but this got better per his family quickly after getting to the hospital.  He had a Brain MRI that was negative for stroke.  He has ben falling more lately and on admission was found down by one of his neighbors for an unknown length of time.  He was confused when he was found and thought to be dehydrated.  He admits to not eating and drinking as much as he should.  Does not like to drink out of concern that it is uncomfortable because after every beverage he says he needs to urinate due to his prostate. MEDICAL HISTORY:   GERD. Chronic constipation.  Opioid chronic use.  Iron deficiency anemia.  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  Aortic sclerosis with mitral regurgitation.  TIA.  Depression.  BPH.  Multilevel DJD.  Lower extremity varices.  Chronic abdominal pain.  SURGICAL HISTORY:  Appendectomy.  Vasectomy.  Cholecystectomy HISTORY:  Mother died from gallbladder surgery complications and unknown cause of death with his father. HISTORY:  No alcohol or tobacco use.  Lives alone.  Poor medical compliance.  He does not drive.  MEDICATIONS:   Vitamin D3 400 units daily.  Trazodone 50 mg take 1/2 tablet every night.  Rapaflo 4 mg once a day.  Polyethylene  glycol once a day.  Protonix 40 mg daily.  Oxycodone 50 mg every 12 hours.  Norco 5/325 every four hours.  Multivitamins once daily.  Mirtazapine 50 mg once daily.  Melatonin 5 mg once daily.  Gabapentin 200 mg twice daily.  Docusate 100 mg twice daily.  Bupropion 75 mg once daily.  Apresoline 0.25 twice daily.  Aggrenox 25/200 mg twice daily.    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: Needs protection with antibiotics pre-procedure  Hiatal Hernia:   cad:   htn:   Cholecystectomy:   Tonsillectomy:   Appendectomy:   Prostate Surgery:   Hernia Repair:   Home Medications: Medication Instructions Last Modified Date/Time  Norco 5 mg-325 mg oral tablet 1 tab(s) orally every 4 hours, As Needed - for Pain 13-Sep-14 13:44  buPROPion 75 mg oral tablet 1 tab(s) orally once a day (in the morning) 13-Sep-14 13:44  mirtazapine 15 mg oral tablet 1 tab(s) orally once a day (at bedtime) 13-Sep-14 13:44  oxyCODONE 15 mg oral tablet, extended release 1 tab(s) orally every  12 hours 13-Sep-14 13:44  ALPRAZolam 0.25 mg oral tablet 1 tab(s) orally 2 times a day 13-Sep-14 13:44  bisacodyl 10 mg rectal suppository 1 suppository(ies) rectal once a day, As needed, constipation 13-Sep-14 13:44  docusate sodium 100 mg oral capsule 1 cap(s) orally 2 times a day 13-Sep-14 13:44  pantoprazole 40 mg oral delayed release tablet 1 tab(s) orally once a day 13-Sep-14 13:44  Aggrenox 25 mg-200 mg oral capsule, extended release 1 cap(s) orally 2 times a day 13-Sep-14 13:44  Rapaflo 4 mg oral capsule 1 cap(s) orally once a day 13-Sep-14 13:44  polyethylene glycol 3350 oral  powder for reconstitution 17 gram(s) in 8oz of fluid and drink orally once a day. 13-Sep-14 13:44  multivitamin 1 tab(s) orally once a day 13-Sep-14 13:44  Vitamin D3 400 intl units oral capsule 1 cap(s) orally once a day 13-Sep-14 13:44  gabapentin 100 mg oral capsule 2 caps (259m) orally 2 times a day. 13-Sep-14 13:44  melatonin 5 mg oral tablet 1 tab(s) orally once a day (at bedtime) 13-Sep-14 13:44  traZODone 50 mg oral tablet 0.5 tab (270m orally once a day (at bedtime). 13-Sep-14 13:44   [facility] Current Meds:   Acetaminophen * tablet, ( Tylenol (325 mg) tablet)  650 mg Oral q4h PRN for pain or temp. greater than 100.4  - Indication: Pain/Fever  Docusate Sodium capsule, ( Colace)  100 mg Oral bid PRN for constipation  - Indication: Stool Softener  HePARin injection, 5000 unit(s), Subcutaneous, q8h  Indication: Anticoagulant, Monitor Anticoags per hospital protocol  Ondansetron injection, ( Zofran injection )  4 mg, IV push, q4h PRN for Nausea/Vomiting  Indication: Nausea/ Vomiting  Pantoprazole tablet, 40 mg Oral q6am  - Indication: Erosive Esophagitis/ GERD  Instructions:  DO NOT CRUSH  Senna tablet, ( Senokot)  1 tablet(s) Oral bid PRN for constipation  - Indication: Stool Softner/ Constipation/ Bowel Prep for Surgery  Instructions:  1 tablet = 8.6 mg  Acetaminophen-HYDROcodone 325/5 mg tablet, ( Norco  5/325 mg)  1 tablet(s) Oral q4h PRN for pain  - Indication: Pain  Instructions:  [Med Admin Window: 30 mins before or after scheduled dose]  ALPRAZolam tablet, 0.25 mg Oral bid  - Indication: Anxiety/ Depression/ Panic  Aspirin-dipyridamole 25 mg/200 mg, ( Aggrenox)  1 capsule(s) Oral bid  - Indication: Reduction in stroke risk  Gabapentin  capsule, ( Neurontin)  200 mg Oral bid  - Indication: Seizures/ Mood Stabilizer  Mirtazapine tablet, ( Remeron)  15 mg Oral at bedtime  - Indication: Depression  Non-Formulary Medication, Rapaflo 4 mg oral capsule  4 mg Oral  daily at bedtime  Instructions:  silodosin (generic) = Rapaflo (brand)  Bisacodyl suppository, ( Dulcolax suppository )  10 mg Rectal daily PRN for constipation  -Indication:Constipation  Polyethylene Glycol Powder, ( Miralax powder )  17 gram Oral daily PRN for constipation  -Indication:Constipation  buPROPion HCl tablet, ( Wellbutrin (immediate release))  75 mg Oral qam  - Indication: Depression/ Smoking Cessation  Cholecalciferol  tablet, ( Vitamin D3)  400 unit(s) Oral daily  - Indication: Vit D deficiency  Multivitamin tablet, ( Vitamin - Multiple)  1 tablet(s) Oral daily  - Indication: Prevention and Treatment of Vitamin Deficiencies  Nursing Saline Flush, 3 to 6 ml, IV push, Q1M PRN for IV Maintenance  oxyCODONE SR tablet,  ( OxyCONTIN ER)  15 mg Oral q12h  - Indication: Pain  Instructions:  DO NOT CRUSH  [Med Admin Window: 30 mins before or after scheduled dose]  Allergies:  Sulfa drugs: GI Distress  Morphine: Other  Vital Signs: **Vital Signs.:   17-Sep-14 03:50  Vital Signs Type Routine  Temperature Temperature (F) 97.9  Celsius 36.6  Temperature Source oral  Pulse Pulse 64  Respirations Respirations 20  Systolic BP Systolic BP 696  Diastolic BP (mmHg) Diastolic BP (mmHg) 84  Mean BP 110  Pulse Ox % Pulse Ox % 95  Pulse Ox Activity Level  At rest  Oxygen Delivery Room Air/ 21 %   EXAM: PHYSICAL EXAMINATION  GENERAL: Pleasant.  Thin.  Very hard of hearing.  NAD.  Normocephalic and atraumatic.  EYES: Funduscopic exam shows normal disc size, appearance and C/D ratio without clear evidence of papilledema.  CARDIOVASCULAR: S1 and S2 sounds are within normal limits, without murmurs, gallops, or rubs.  MUSCULOSKELETAL: Bulk - Thin Tone - Normal Pronator Drift - Absent bilaterally. Ambulation - Gait and station are mildly unsteady.  R/L 5/5    Shoulder abduction (deltoid/supraspinatus, axillary/suprascapular n, C5) 5/5    Elbow flexion (biceps  brachii, musculoskeletal n, C5-6) 5/5    Elbow extension (triceps, radial n, C7) 5/5    Finger adduction (interossei, ulnar n, T1)   5/5    Hip flexion (iliopsoas, L1/L2) 5/5    Knee flexion (hamstrings, sciatic n, L5/S1) 5/5    Knee extension (quadriceps, femoral n, L3/4) 5/5    Ankle dorsiflexion (tibialis anterior, deep fibular n, L4/5) 5/5    Ankle plantarflexion (gastroc, tibial n, S1)  NEUROLOGICAL: MENTAL STATUS: Patient is oriented to person, place and time.  Recent memory is mildly reduced and remote memory is intact.  Attention span and concentration are intact.  Naming, repetition, comprehension and expressive speech are within normal limits.  Patient's fund of knowledge is within normal limits for educational level.  CRANIAL NERVES: Normal    CN II (normal visual acuity and visual fields) Normal    CN III, IV, VI (extraocular muscles are intact) Normal    CN V (facial sensation is intact bilaterally) Normal    CN VII (facial strength is intact bilaterally) Abnormal    CN VIII (hearing is decreased bilaterally) Normal    CN IX/X (palate elevates midline, normal phonation) Normal    CN XI (shoulder shrug strength is normal and symmetric) Normal    CN XII (tongue protrudes midline)   SENSATION: Intact to pain and temp bilaterally (spinothalamic tracts) Intact to position and vibration bilaterally (dorsal columns)   REFLEXES: R/L 2+/2+    Biceps 2+/2+    Brachioradialis   2+/2+    Patellar 2+/2+    Achilles   COORDINATION/CEREBELLAR: Finger to nose testing is within normal limits..  Lab Results:  Thyroid:  14-Sep-14 03:21   Thyroid Stimulating Hormone 0.833 (0.45-4.50 (International Unit)  ----------------------- Pregnant patients have  different reference  ranges for TSH:  - - - - - - - - - -  Pregnant, first trimetser:  0.36 - 2.50 uIU/mL)  Hepatic:  13-Sep-14 12:39   Bilirubin, Total 1.0  Alkaline Phosphatase 71  SGPT (ALT) 18  SGOT (AST) 35  Total  Protein, Serum 7.2  Albumin, Serum 3.7  Routine Micro:  13-Sep-14 13:32   Micro Text Report URINE CULTURE   COMMENT                   NO GROWTH IN 36 HOURS   ANTIBIOTIC  Specimen Source IN/OUT CATH  Culture Comment NO GROWTH IN 36 HOURS  Result(s) reported on 03 Sep 2013 at 08:47AM.  Routine Chem:  14-Sep-14 03:21   Magnesium, Serum  1.7 (1.8-2.4 THERAPEUTIC RANGE: 4-7 mg/dL TOXIC: > 10 mg/dL  -----------------------)  15-Sep-14 05:23   Glucose, Serum 80  BUN 9  Creatinine (comp) 1.03  Sodium, Serum 141  Potassium, Serum 3.6  Chloride, Serum  110  CO2, Serum 24  Calcium (Total), Serum  8.4  Anion Gap 7  Osmolality (calc) 279  eGFR (African American) >60  eGFR (Non-African American) >60 (eGFR values <18m/min/1.73 m2 may be an indication of chronic kidney disease (CKD). Calculated eGFR is useful in patients with stable renal function. The eGFR calculation will not be reliable in acutely ill patients when serum creatinine is changing rapidly. It is not useful in  patients on dialysis. The eGFR calculation may not be applicable to patients at the low and high extremes of body sizes, pregnant women, and vegetarians.)  Cardiac:  13-Sep-14 12:39   Troponin I < 0.02 (0.00-0.05 0.05 ng/mL or less: NEGATIVE  Repeat testing in 3-6 hrs  if clinically indicated. >0.05 ng/mL: POTENTIAL  MYOCARDIAL INJURY. Repeat  testing in 3-6 hrs if  clinically indicated. NOTE: An increase or decrease  of 30% or more on serial  testing suggests a  clinically important change)  Routine UA:  13-Sep-14 13:32   Color (UA) Yellow  Clarity (UA) Hazy  Glucose (UA) Negative  Bilirubin (UA) Negative  Ketones (UA) 1+  Specific Gravity (UA) 1.026  Blood (UA) Negative  pH (UA) 5.0  Protein (UA) Negative  Nitrite (UA) Negative  Leukocyte Esterase (UA) Negative (Result(s) reported on 01 Sep 2013 at 02:00PM.)  RBC (UA) 1 /HPF  WBC (UA) 2 /HPF  Bacteria (UA) NONE SEEN   Epithelial Cells (UA) 1 /HPF  Mucous (UA) PRESENT  Hyaline Cast (UA) 3 /LPF (Result(s) reported on 01 Sep 2013 at 02:00PM.)  Routine Hem:  16-Sep-14 04:07   WBC (CBC) 8.9  RBC (CBC)  3.95  Hemoglobin (CBC)  12.5  Hematocrit (CBC)  36.7  Platelet Count (CBC) 249  MCV 93  MCH 31.6  MCHC 34.1  RDW  14.8  Neutrophil % 48.9  Lymphocyte % 37.9  Monocyte % 9.2  Eosinophil % 3.0  Basophil % 1.0  Neutrophil # 4.3  Lymphocyte # 3.4  Monocyte # 0.8  Eosinophil # 0.3  Basophil # 0.1 (Result(s) reported on 04 Sep 2013 at 0[facility])   Radiology Results: MRI:    15-Sep-14 10:38, MRI Brain Without Contrast  MRI Brain Without Contrast   REASON FOR EXAM:    slurred speech  COMMENTS:       PROCEDURE: MR  - MR BRAIN WO CONTRAST  - Sep 03 2013 10:38AM     RESULT: History: Slurred speech    Technique: Multiplanar, multisequence MRI of the brain was obtained   without IV contrast.     Comparison:  None    Findings:     There is no acute infarct. There is no hemorrhage. There is no pathologic     extra-axial fluid collection. There is generalized cerebral atrophy.   There is periventricular and deep white matter T2 and FLAIR   hyperintensity likely secondary to microangiopathy. There is no   hydrocephalus. The ventricles are normal for age. The basal cisterns are   patent. There are no abnormal extra-axial fluid collections.     The visualized paranasal sinuses and mastoid sinuses are  clear. The skull   base and calvarium demonstrate normal signal. The major intracranial flow   voids, including dural venous sinuses appear patent.    IMPRESSION:     No acute intracranial pathology.    Dictation Site: 1    Verified By: Jennette Banker, M.D., MD   Impression/Recommendations: Recommendations:   81 year old man presents with symptoms of worsening falls recently and concern for memory loss.   agree on exam the patient seems to have some mild memory loss.  However, for his memory  to be properly evaluated he will require an outpatient visit.  Given his age, the new location and other medical comorbidities (severe dehydration during this admission), may potentially be negative impacting his cognitve capacity at the present time.  He shoudl be evaluated as an outpatient when he is back in his normal routines at home.  Given the concerns for medication noncompliance and poor PO intake at home by himself, I would recommend an assisted living facility for him.  Also, this would be a great way for him to make some new friends in his age group.  He does not seem interested in this at present but I have asked that he think about it in case he changes his mind.  He should have home health assessment to see if he qualifies for nursing care, atlhough home assessment may be best performed after outpatient cognitive assessment.  No indication to start medication for memory loss at present.  BP and labs are normalizing.  Unsteady ambulation, recommend that he only ambulate with a cane or walker.  Would benefit from outpatient PT for gait and balance training.  Slurred speech has resolved.  Personally viewed Brain MRI, negative for stroke or other focal abnormalities.   have reviewed the results of the most recent imaging studies, tests and labs as outlined above and answered all related questions.  Melrose Nakayama, MD  Electronic Signatures: Anabel Bene (MD)  (Signed 18-Sep-14 01:41)  Authored: REFERRING PHYSICIAN, Primary Care Physician, Consult, History of Present Illness, Review of Systems, PAST MEDICAL/SURGICAL HISTORY, HOME MEDICATIONS, Current Medications, ALLERGIES, NURSING VITAL SIGNS, Physical Exam-, LAB RESULTS, RADIOLOGY RESULTS, Recommendations   Last Updated: 18-Sep-14 01:41 by Anabel Bene (MD)

## 2015-04-11 NOTE — Consult Note (Signed)
PATIENT NAME:  Douglas Carlson, Douglas Carlson MR#:  [id number] DATE OF BIRTH:  Oct 31, 1925  DATE OF CONSULTATION:  07/17/2013  REFERRING PHYSICIAN:  Daniel NonesBert Klein, MD. CONSULTING PHYSICIAN:  Ardeen FillersUzma S. Garnetta BuddyFaheem, MD.  REASON FOR CONSULTATION: Falls and confusion and severe depression.   HISTORY OF PRESENT ILLNESS: The patient is an 81 year old male with history of severe degenerative disk disease and scoliosis and chronic pain related to this including chronic pain in his legs and history of major depression. Admitted, as he appeared to be confused on Friday and brought by the family members as he had fallen. The patient's family members found him on the floor. They reported that his speech was mildly slurred, but it gradually improved. The patient has been complaining of worsening leg pain and was noted to have bruises on his left forehead.   The patient remained confused and reported that he was no longer able to ambulate, so they brought him into the hospital. The patient was anxious and tearful at times. He was complaining of pain in his legs and bruising on his left forehead.   The patient's main complaint at the time of admission was pain in the back of his bilateral thighs; however, this remains a chronic pain and he also has evidence of dehydration. He was admitted for stabilization and safety.   During my interview, the patient remains confused and somewhat agitated. He reported that he cannot understand me clearly. His sister and one of his friends were also present in the room. The patient reported that he lives by himself, but he has tried a walker but he kept having falling. The patient reported that his family, including his children and grandchildren, live close by and they keep an eye on him. He stated that he loses his balance often. The patient was unable to answer most of the questions as he has difficulty understanding the questions. His sister was also becoming agitated and she reported that she cannot  understand the questions I have been asking, and they do not want to participate much in the interview.   Collateral information was obtained from his treating physician, Dr. Graciela HusbandsKlein, who was concerned about the patient's state of health and reported that the patient has been taking Remeron for the past, 2009, and has not improved much on the medication. He has been severely depressed and he chose not to answer questions.   He also discussed that the patient's family, including his sons and daughters, have been actively involved in his care, and the patient does not seem to have much understanding about his state of health. The patient was noted to be ambulating with the help of the walker when the physical therapy staff came around and did not have any difficulty walking with them.   The patient does not have any suicidal or homicidal ideations or plans.   PAST MEDICAL HISTORY:  1.  GERD with severe hiatal hernia.  2.  Rotator cuff tear with surgery recommended which the patient deferred.  3.  Varicosities.  4.  Chronic constipation.  5.  Iron deficiency anemia.  6.  Chronic bilateral lower extremity cramping with prior normal ankle brachial indices thought to be related to degenerative disk disease.  7.  Left inguinal hernia.  8.  Severe degenerative disk disease.  9.  Scoliosis.  10.  Valvular heart disease with mild aortic sclerosis and moderate mitral regurgitation in the past.  11.  Chronic abdominal and chest pain due to degenerative disk disease.  12.  Traumatic left elbow bursitis.  13.  TIA. 14.  Major depression.  15.  BPH. 16.  Status post retinectomy. 17.  Status post vasectomy. 18.  Status post cholecystectomy.   ALLERGIES: SULFA.   CURRENT HOME MEDICATIONS: Colace 100 mg b.i.d., melatonin 5 mg at bedtime, Remeron 45 mg at bedtime, multivitamin, Neurontin 200 b.i.d., vitamin D, Xanax 0.5 b.i.d.,  Lopressor 25 mg b.i.d., Aggrenox 25/200 b.i.d., vitamin E, OxyContin 20 in the  morning and 10 at night and Rapaflo 8 mg at bedtime.   SOCIAL HISTORY: The patient currently lives by himself. His family lives close by and they are all very supportive.   REVIEW OF SYSTEMS: The patient did not participate much in the review of systems at this time.   PHYSICAL EXAMINATION:  VITAL SIGNS: Temperature 98.4, pulse 73, respirations 20, blood pressure 127/69.  MENTAL STATUS EXAMINATION: A thinly built male who was lying in the bed. He appears somewhat agitated and anxious. His speech was slow in response. Mood appeared anxious. Affect was congruent. Thought process was tangential. Thought content was non-delusional. He demonstrated poor insight and judgment regarding his admission to the hospital and did not participate much in the interview. He was not able to answer most of the questions appropriately.   LABORATORIES: Glucose 87, BUN 11, creatinine 1.06, sodium 140, potassium 3.7, chloride 107, bicarbonate 27, anion gap 6, osmolality 278, calcium 8.2. Protein 5.5, albumin 2.7. Bilirubin is 0.8, AST 37, ALT 28. TSH is 1.36. RBC 3.54, hemoglobin 11.1, hematocrit 32.5.   DIAGNOSTIC IMPRESSION:  AXIS I: Major depressive disorder, recurrent, moderate. Dementia, not otherwise specified.   TREATMENT PLAN: I discussed with the patient and his family about the reason for admission as well as with his treating physician. At this point, patient does not appear to have the capacity to make decisions about his living situation as well as with the treatment plan.   1.  I would recommend that patient should be placed as it is not safe for him to be living by himself due to his recurrent falls and about his medication and his health.  2.  I will also adjust his medications as follows:   A. I will decrease the dose of Remeron to 15 mg p.o. at bedtime.   B. I will add bupropion 75 mg in the morning.  C. I will discontinue the melatonin as this increases the risk of falls.   D. I will add trazodone  25 mg p.o. at bedtime for insomnia.   E. I will also decrease the alprazolam to 0.25 mg p.o. b.i.d. as it also increases the risk of  falls.   F. I will also decrease gabapentin to 100 mg p.o. b.i.d. as it also abuses the risk of falls.  Carlson. The patient is also on OxyContin which might increase the risk of falls in the elderly  patient, and the dose might need to be reviewed by his treating physician.   Thank you for allowing me to participate in the care of this patient.    ____________________________ Ardeen Fillers. Garnetta Buddy, MD usf:np D: 07/17/2013 16:03:36 ET T: 07/17/2013 16:24:32 ET JOB#: [id number]  cc: Ardeen Fillers. Garnetta Buddy, MD, <Dictator> Rhunette Croft MD ELECTRONICALLY SIGNED 07/24/2013 12:35

## 2015-04-11 NOTE — H&P (Signed)
PATIENT NAME:  Douglas Carlson, Douglas G MR#:  [id number] DATE OF BIRTH:  August 11, 1925  DATE OF ADMISSION:  09/01/2013  PRIMARY CARE PHYSICIAN:  Dr. Daniel NonesBert Klein.   REFERRING PHYSICIAN:  Dr. Cyril LoosenKinner.   CHIEF COMPLAINT:  Falls and confusion.   HISTORY OF PRESENT ILLNESS:  This is a very nice 81 year old gentleman recently admitted over here on 07/16/2013 with a chief complaint of falls and confusion as well.  The family had a lot of trouble with this patient as far as compliance with medical treatment, compliance with drinking fluids, compliance with taking his medications.  The patient lives by himself and he has been very adamant about moving out of the house to any facility.  Last time whenever he was discharged he was able to be sent to a skilled nursing facility where he spent three weeks.  The history today is that the patient comes and he was found down on the floor on the garden by one of the neighbors, unknown for how long, but the patient looked very dehydrated and he was confused.  Apparently, as per the family, the patient has been really confused and has been falling on multiple occasions.  He has very slurred speech and his tongue is very dry.   The patient has not been eating or drinking enough food or fluids because he does not want to go to the bathroom.  He states that his prostate is a burden and every time that he drinks fluids he needs to urinate and he just does not want to go very often.  The patient also states that he believes he has diarrhea, but as per the family, he has been just absolutely constipated and he has some stool coming around the constipation.  The patient at this moment is slightly confused, but he is cooperative.  We are going to admit him for evaluation of possible infection as he has elevation of his white blood cells.  Also, for treatment of severe dehydration.   I do not think the patient has stroke, but he has slurry speech.  Last time when he was here he was not  evaluated with an MRI for what I am going to do an MRI this time.   REVIEW OF SYSTEMS:  A 12 system review of systems unable to obtain due to patient's confusion.   PAST MEDICAL HISTORY:   1.  GERD. 2.  Chronic constipation.  3.  Opioid chronic use.  4.  Iron deficiency anemia.  5.  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  6.  Aortic sclerosis with mitral regurgitation.  7.  TIA.  8.  Depression.  9.  BPH.  10.  Multilevel DJD.  11.  Lower extremity varices.  12.  Chronic abdominal pain.   PAST SURGICAL HISTORY:  1.  Appendectomy.  2.  Vasectomy.  3.  Cholecystectomy.    ALLERGIES:  The patient allergic to SULFA AND MORPHINE.   SOCIAL HISTORY:  The patient denies any tobacco abuse or alcohol abuse.  He lives by himself.  His family is very concerned about this because he does not take a good care of himself apparently.  He does not drive.   FAMILY HISTORY:  Mother died from gallbladder surgery complications and his father has no apparent cause of death.   CURRENT MEDICATIONS:   1.  Vitamin D3 400 units daily.  2.  Trazodone 50 mg take 1/2 tablet every night.  3.  Rapaflo 4 mg once a day.  4.  Polyethylene glycol once a day.  5.  Protonix 40 mg daily.  6.  Oxycodone 50 mg every 12 hours.  7.  Norco 5/325 every four hours.  8.  Multivitamins once daily.  9.  Mirtazapine 50 mg once daily.  10.  Melatonin 5 mg once daily.  11.  Gabapentin 200 mg twice daily.  12.  Docusate 100 mg twice daily.  13.  Bupropion 75 mg once daily.  14.  Apresoline 0.25 twice daily.  15.  Aggrenox 25/200 mg twice daily.   PHYSICAL EXAMINATION: VITAL SIGNS:  BP 113/51, pulse 71, respirations 18, temperature 97.9.  GENERAL:  The patient is alert and oriented x 2.  He knows where he is, he knows his name, but he has slurred speech, he is confused otherwise and answered questions with the wrong answer and his speech is tangential.   HEENT:  His pupils are equal and reactive.  Extraocular movements  are intact.  Mucosa are very dry.  Anicteric sclerae.  Pink conjunctivae.  No oral lesions.  No oropharyngeal exudates.  NECK:  Supple.  No JVD.  No thyromegaly.  No adenopathy.  No carotid bruits.  CARDIOVASCULAR:  Regular rate and rhythm.  No murmurs, rubs or gallops.  No displacement of PMI.  LUNGS:  Clear without any wheezing or crepitus.  No use of accessory muscles.  ABDOMEN:  Soft, nontender, nondistended.  No hepatosplenomegaly.  No masses.  Bowel sounds are positive.  GENITAL:  Negative for external lesions.  EXTREMITIES:  No edema, cyanosis or clubbing.  Pulses +2.  Capillary refill less than 3.  NEUROLOGIC:  Speech is slurred and tangential, but the patient does not have any facial droop.  Strength is 5 out of 5 in four extremities.  Sensation is normal in all four extremities.  Otherwise intact. SKIN:  No rashes or petechiae.  Decreased turgor.  PSYCHIATRIC:  The patient is slightly confused, but no agitation.  LYMPHATIC:  Negative for lymphadenopathy in the neck or supraclavicular areas.   LABORATORY DATA:  Glucose is 136, creatinine is 1.09, sodium 136, potassium 3.8.  LFTs within normal limits.  His white blood count is 17,000, hemoglobin is 13 and platelet count is 232.  Urinalysis shows 2 white blood cells, negative nitrites, negative leukocyte esterase.   EKG:  Normal sinus rhythm.  No ST depression or elevation.   ASSESSMENT AND PLAN:   1.  This is a nice 81 year old gentleman with history of multiple medical problems, patient of Dr. Graciela Husbands, who has been recently admitted over here for the same problem that we have right now, altered mental status, fall, confusion.  The patient had a CT scan without any significant abnormalities or bleeding.  The patient is severely dehydrated for what he is going to get IV fluids, rule out the possibility of urinary tract infection.  We will send a urine culture.  To rule out the possibility of pneumonia we are going to repeat the chest x-ray in  the morning after appropriate hydration.  2.  The patient has an elevation of the white blood count, could be secondary to dehydration.  He does not show any other signs of infection.  No fever.  He is not tachycardic, but he is dehydrated.  Overall the patient is probably going to need to have some other decisions taking in place this hospitalization because he is not able to take care of himself.  The family has been repeating this and he does not seem to understand that he  needs to hydrate himself and eat.  We are going to do a psychiatric evaluation to determine decision-making capacity.  I am going to get an MRI of the brain as the patient still has slurred speech.  This slurred speech is very likely secondary to the dehydration and his tongue is very dry, but the patient is not showing any other signs of a stroke.  So, actually reviewing the data from previous admission, the patient already has been granted not to being able to making his own decisions, so no need to repeat that evaluation at this moment.  Overall, the patient is going to be likely need to be placed again in a different facility and the family needs to work on possibly assisted living facility or permanent placement in a nursing home.  Again, the patient is not able to make his own decisions.  He already has been evaluated by psych.   Other medical problems are stable.  The patient seems to be severely constipated, we are going to get a KUB to evaluate that and keep him on stool softeners.    I am going to transfer the care to Dr. Graciela Husbands.  I spent about 60 minutes with this patient today.     ____________________________ Felipa Furnace, MD rsg:ea D: 09/01/2013 15:56:27 ET T: 09/01/2013 16:41:48 ET JOB#: [id number]  cc: Felipa Furnace, MD, <Dictator> Hansford Hirt Juanda Chance MD ELECTRONICALLY SIGNED 09/03/2013 13:58

## 2015-04-11 NOTE — Consult Note (Signed)
Chief Complaint:  Subjective/Chief Complaint seen  for abdominal pain and distension.  feeling better, denies pain nausea chest pain or shortness of breath.   VITAL SIGNS/ANCILLARY NOTES: **Vital Signs.:   03-Oct-14 04:35  Vital Signs Type Routine  Temperature Temperature (F) 97.5  Temperature Source oral  Pulse Pulse 65  Respirations Respirations 20  Systolic BP Systolic BP 962  Diastolic BP (mmHg) Diastolic BP (mmHg) 64  Mean BP 85  Pulse Ox % Pulse Ox % 95  Pulse Ox Activity Level  At rest  Oxygen Delivery Room Air/ 21 %  *Intake and Output.:   03-Oct-14 02:43  Stool  SMALL  LOOSE STOOL    08:25  Stool  small loose stool    08:42  Stool  smear   Brief Assessment:  Cardiac Regular   Respiratory clear BS   Gastrointestinal details normal Soft  Nontender  Nondistended  Bowel sounds normal  scaphoid   Lab Results: Hepatic:  03-Oct-14 05:20   Bilirubin, Total 1.0  Alkaline Phosphatase 106  SGPT (ALT) 73  SGOT (AST) 28  Total Protein, Serum 6.4  Albumin, Serum  3.2  General Ref:  01-Oct-14 08:05   Hepatitis Panel A, B, C ========== TEST NAME ==========  ========= RESULTS =========  = REFERENCE RANGE =  HEPATITIS PANEL A, B, C  HP5+HAVIgM+HBcIgM Hep A Ab, IgM                   [   Negative             ]          Negative Hep A Ab, Total                 [   Negative             ]          Negative HBsAg Screen                    [   Negative             ]          Negative Hep B Core Ab, IgM              [   Negative             ]          Negative Hep B Core Ab, Tot              [   Negative             ]         Negative Hep B Surface Ab, Qual          [   Non Reactive         ]                                                Non Reactive: Inconsistent with immunity,                                            less than 10 mIU/mL            Reactive:  Consistent with immunity,                                            greater than 9.9 mIU/mL HCV Ab                           [   <0.1 s/co ratio      ]           0.0-0.9 Comment:                        [   Final Report    ]                   Non reactive HCV antibody screen is consistent with no HCV infection, unless recent infection is suspected or other evidence exists to indicate HCV infection.               [facility]            No: [id number]          [address] [place], [place] [address]           Lindon Romp, MD         [phone]   Result(s) reported on 20 Sep 2013 at 08:47AM.  Routine Chem:  03-Oct-14 05:20   Glucose, Serum 85  BUN 10  Creatinine (comp) 0.98  Sodium, Serum 136  Potassium, Serum 3.5  Chloride, Serum 106  CO2, Serum 22  Calcium (Total), Serum 8.9  Osmolality (calc) 270  eGFR (African American) >60  eGFR (Non-African American) >60 (eGFR values <42m/min/1.73 m2 may be an indication of chronic kidney disease (CKD). Calculated eGFR is useful in patients with stable renal function. The eGFR calculation will not be reliable in acutely ill patients when serum creatinine is changing rapidly. It is not useful in  patients on dialysis. The eGFR calculation may not be applicable to patients at the low and high extremes of body sizes, pregnant women, and vegetarians.)  Anion Gap 8   Radiology Results: XRay:    03-Oct-14 09:13, Abdomen Flat and Erect  Abdomen Flat and Erect   REASON FOR EXAM:    large hiatal hernia, gastric distension, please   compare with previous  COMMENTS:       PROCEDURE: DXR - DXR ABDOMEN 2 V FLAT AND ERECT  - Sep 21 2013  9:13AM     RESULT: Supine and upright views of the abdomen are reviewed. Comparison   is made to study of September 19, 2013.    There is a normal appearance of the colonic gas pattern. There is a small   amount of gas in the region of the rectum. There are small amounts of   small bowel gas which are felt to be within the limitsof normal today.   No free extraluminal gas collections are demonstrated.  The stomach   remains largely intrathoracic but is little changed from the previous   exam.  Again demonstrated are surgical clips in the gallbladder fossa.   Significant mid lumbar dextroscoliosis is present and stable.    IMPRESSION:    1. The bowel gas pattern does not suggest obstruction or ileus currently.   There is no evidence perforation.  2. The largely intrathoracic stomach remains moderately distended.  Dictation Site: 2        Verified By: DAVID A. Martinique, M.D., MD   Assessment/Plan:  Assessment/Plan:  Assessment 1) abdominalpain, distension of abdomen.  much improved, currently still with ngt for decompression, labs normalizing.  concern for GOO.  EGD today.  I ahce discussed the risks benefits and complications of egd to include not limited to bleeding infection perforaton and he and sone wish to proceed.   Plan 1) as above, further recs to follow.   Electronic Signatures: Loistine Simas (MD)  (Signed 03-Oct-14 09:45)  Authored: Chief Complaint, VITAL SIGNS/ANCILLARY NOTES, Brief Assessment, Lab Results, Radiology Results, Assessment/Plan   Last Updated: 03-Oct-14 09:45 by Loistine Simas (MD)

## 2015-04-11 NOTE — Consult Note (Signed)
Brief Consult Note: Diagnosis: abdominal pain.   Patient was seen by consultant.   Consult note dictated.   Comments: Appreciate consult for 81 y/o caucasian man with history of chronic pain necessitating chronic opiod therapy, chronic constipation, depression, TIA, DJD, mitral regurgitation, aortic sclerosis, for evaluation of abdominal pain. Patient is not a good historian due to confusion. Does not know how he got here. No family is with him. Evidentally was brought to ED for abdominal pain yesterday with 10/10 epigastric pain and nausea, No vomitining. Unsure of last bm, but reported to still be passing flatus. Was found to have some elevated LFts that are improving.  NGT intact with mild to moderate amount of green drainage. Abdomen soft, nd/nt  Impression and plan- Possibly gastric outlet obstruction v other, causing abdominal pain, abdominal distension. better with decompression and present therapy.  Continue to hold aggrenox and will plan for endoscopy when clinically feasible. Do also recommend echo to eval valvular disease.  Electronic Signatures: Vevelyn PatLondon, Morry Veiga H (NP)  (Signed 01-Oct-14 22:02)  Authored: Brief Consult Note   Last Updated: 01-Oct-14 22:02 by Keturah BarreLondon, Marzetta Lanza H (NP)

## 2015-04-11 NOTE — Consult Note (Signed)
PATIENT NAME:  Douglas Carlson, Amaro G MR#:  [id number] DATE OF BIRTH:  12-12-1925  DATE OF ADMISSION:  09/18/2013 DATE OF CONSULTATION:  09/19/2013  REFERRING PHYSICIAN: Dr. Clint GuyHower  CONSULTING PHYSICIAN:  Keturah Barrehristiane H. Jamesyn Moorefield, NP  GI has been consulted at the request of Dr. Clint GuyHower for evaluation of epigastric pain and CT evidence of abdominal distention.   Appreciate this consult for an 81 year old man with history of chronic pain necessitating chronic opioid therapy, chronic constipation, depression, TIA, DJD, mitral regurgitation, aortic sclerosis, for evaluation of epigastric abdominal pain. The patient is not a good historian due to confusion. Does not know how he got here. No family is with him. Evidently was brought to the ED for the abdominal pain yesterday from the [facility] where he lives, with 10/10 epigastric pain and nausea. No vomiting. Unsure of last BM, but reported to still be passing flatus. Was found to have some elevated LFTs that are improving. Currently has NG tube, which is intact with mild to moderate amount of green drainage. Abdomen is soft, nondistended and nontender.   REVIEW OF SYSTEMS:  Limited due to patient's mental status, absence of family. According to the chart review, 10 systems review is significant for hardness of hearing. Otherwise, review is negative.   PAST MEDICAL HISTORY: GERD, hiatal hernia, constipation, chronic opioid use, depression, iron deficiency anemia, severe DJD, aortic sclerosis, mitral regurgitation, TIA, BPH, chronic abdominal pain, cholecystectomy, appendectomy, vasectomy.   SOCIAL HISTORY: Currently resides [facility]. No tobacco, alcohol or illicits.   FAMILY HISTORY: Significant for mother deceased due to complications with gallbladder surgery. No other known history.   ALLERGIES: MORPHINE, SULFA.   HOME MEDICATIONS: Aggrenox 25/200 mg p.o. b.i.d., alprazolam 0.25 p.o. b.i.d., bisacodyl 10 mg suppository PR daily  p.r.n., Wellbutrin 75 mg p.o. daily, cholecalciferol 400 International Units p.o. daily, Colace 100 mg 2 caps p.r.n. daily, gabapentin 200 mg p.o. b.i.d., Remeron 50 mg p.o. daily, multivitamin 1 tab p.o. daily, Norco 5/325 q.4h. p.r.n. pain, oxycodone 30 mg extended release 1 tab b.i.d., pantoprazole 40 mg p.o. daily, MiraLAX 17 grams p.o. daily p.r.n.,  senna 1 tab b.i.d. p.o. p.r.n. daily constipation, silodosin 4 mg p.o. daily.   VITAL SIGNS:  Most recent:  Temp 98.1, pulse 63, respiratory rate 20, blood pressure 137/70, SaO2 is 93%. There are 200 mL of green drainage in the NG canister.   LABORATORY DATA: Most recent:  Glucose 118, BUN 13, creatinine 1.14, sodium 135, potassium 4.1, chloride 103, GFR 58, lipase 93, total protein 6.6, albumin 3.4, total bilirubin 0.7, ALP 132, AST 72, ALT 27. Troponins are negative. CBC was essentially normal. CT with intrathoracic distended stomach, more conspicuous than on  prior studies, scoliosis, COPD. No other abnormalities noted.   PHYSICAL EXAMINATION:  GENERAL: Thin, adult, elderly man, resting in bed in no acute distress.  HEENT: Normocephalic, atraumatic. No redness, drainage or inflammation to the eyes, nares or mouth.  NG tube intact to naris.  NECK: Supple. No JVD or thyromegaly.  CHEST: Respirations eupneic.  LUNGS: CTAB.  CARDIAC: S1, S2, RRR without MRG. No edema. Radial pulses 2+.  ABDOMEN: Flat, soft, nondistended, nontender. No hepatosplenomegaly, masses or other abnormalities appreciated. No peritoneal signs.  MUSCULOSKELETAL: MAEW x 4  Mild generalized weakness. Strength 4/5. Sensation appears to be intact. Moves all extremities spontaneously.  NEUROLOGIC: Alert, oriented x 1. Cranial nerves II through XII intact.  SKIN: Warm, dry, pink. No obvious erythema or rash.  PSYCHIATRIC: Confused, pleasant, cooperative.  IMPRESSION AND PLAN: Abdominal pain questionable for gastric outlet obstruction versus other causing abdominal pain,  abdominal distention; better with decompression and present therapy. Continue to hold Aggrenox, and we will plan for endoscopy when clinically feasible. Do also recommend an echocardiogram to evaluate valvular disease.   These services were provided by Vevelyn Pat in collaboration with Christena Deem, M.D., with whom I have discussed this patient in full.   Thank you very much for this consult.   ____________________________ Keturah Barre, NP chl:dmm D: 09/19/2013 22:15:00 ET T: 09/19/2013 22:29:32 ET JOB#: [id number]  cc: Keturah Barre, NP, <Dictator> Eustaquio Maize Dellis Voght FNP ELECTRONICALLY SIGNED 09/24/2013 10:07

## 2015-04-11 NOTE — Consult Note (Signed)
Brief Consult Note: Diagnosis: Major depressive episode, Cognitive disorder NOS.   Patient was seen by consultant.   Consult note dictated.   Recommend further assessment or treatment.   Comments: Mr. Vear Clockhillips has a h/o depression, mild cognitive decline, and falls. He was admitted after another fall due to dehydration. He limits his liquid intake to avoid trips to the bathroom. He can not be convinced otherwise. Per family he is ni longer able to care for himself.   PLAN: 1. The patient does not have the capacity to make decisions about disposition.   2. Please continue Wellbutrin, Remeron and low dose Xanax,   3. I will sign off. Please call if problems.  Electronic Signatures: Kristine LineaPucilowska, Reg Bircher (MD)  (Signed 15-Sep-14 17:40)  Authored: Brief Consult Note   Last Updated: 15-Sep-14 17:40 by Kristine LineaPucilowska, Kalen Ratajczak (MD)

## 2015-04-11 NOTE — H&P (Signed)
PATIENT NAME:  Douglas Carlson, Douglas Carlson DATE OF BIRTH:  03-Jan-1925  DATE OF ADMISSION:  07/16/2013  CHIEF COMPLAINT: Falls and confusion.   HISTORY OF PRESENT ILLNESS: The patient is an 81 year old male with a history of severe degenerative disk disease and scoliosis, with chronic pain related TO this, including chronic pain in the legs, and history of major depression which has made it challenging at times for him to understand his debility; he also has history of BPH and urinary hesitancy. His family members report that he appeared to be confused on Friday, and report that he had fallen, they are not sure whether this occurred or not. On Saturday they came to find the patient and he was in the floor. It is unclear how long he had been on the floor. They thought that his speech might be slurred, but it improved. He complained of worsening leg pain and was noted to have some bruises on his left forehead. He has remained confused and feels like he can no longer ambulate and so they brought him into the office today. The patient is anxious and tearful at times. He complains of pain in both of his hips and has some bruising on his left forehead. His main complaint is pain in the back of his bilateral thighs, however, this is not a new pain and it is challenging to determine at times what is acute and what is more chronic. Examination of the patient reveals evidence of dehydration. He has an odor of urine about him. He has not had any nausea, vomiting, diarrhea at home. They have not noticed any fevers or chills, but they do report that he has been very cold recently and has been keeping his home very hot because of it. He feels at times that he is short of breath, without chest pain reported. He is brought in under observation for altered mental status, with evidence of head trauma, evidence of dehydration.   PAST MEDICAL HISTORY: 1.  Gastroesophageal reflux disease with severe hiatal hernia.  Previously evaluated by surgery who thought the patient would be at high risk for operative repair.  2.  Rotator cuff tear with surgery recommended, which the patient deferred.  3.  Varicosities. 4.  Chronic constipation.  5.  Iron deficiency anemia.  6.  Chronic bilateral lower extremity cramping with prior normal ankle-brachial indices, thought to be related to degenerative disk disease.  7.  Left inguinal hernia.  8.  Severe degenerative disk disease and scoliosis with previous imaging. Previously evaluated by physical therapy, physiatry.  9.  Valvular heart disease with mild aortic sclerosis and moderate mitral regurgitation in the past.  10.  Chronic abdominal and chest pain thought to be secondary to his degenerative disk disease.  11.  Traumatic left elbow bursitis.  12.  TIA, on Aggrenox therapy.  13.  Major depression, previously followed by psychiatry.  14.  BPH, evaluated by urology, with surgical intervention recommended, which the patient has declined.  15.  Status post appendectomy. 16.  Status post vasectomy.  17.  Status post cholecystectomy.   ALLERGIES: SULFA.   MEDICATIONS: 1.  Colace 100 mg p.o. b.i.d.  2.  Melatonin 5 mg p.o. at bedtime.  3.  Remeron 45 mg p.o. at bedtime.  4.  Multivitamin 1 p.o. daily.  5.  Neurontin 200 mg p.o. b.i.d.  6.  Vitamin D 400 units p.o. daily. 7.  Xanax 0.5 mg p.o. b.i.d.  8.  Lopressor 25 mg p.o. b.i.d.  9.  Aggrenox 25/200 mg 1 p.o. b.i.d.  10.  Vitamin E 400 units daily.  11.  OxyContin 20 mg in a.m., 10 mg at night. 12.  Rapaflo 8 mg at bedtime.   SOCIAL HISTORY: No alcohol or tobacco.   FAMILY HISTORY: Mother died from gallbladder surgery. Father's cause of death is unknown.  REVIEW OF SYSTEMS:  Please see HPI.  Challenging secondary to confusion. Reports that he has been eating. No change in urination reported. No rash recently. Remainder of complete review of systems is negative.   PHYSICAL EXAMINATION: VITAL SIGNS:  Temperature 97.6, blood pressure 118/62, pulse 64.  GENERAL: Thin, elderly male, tearful at times.  EYES: Pupils are round and reactive to light. Extraocular motion is intact. Bruising noted on the left temple.  EARS, NOSE AND THROAT: Oropharynx is dry without lesions.  NECK: Kyphosis with trachea midline. Decreased flexion and extension.  HEART:  Regular rate and rhythm, 2/6 systolic murmur. No gallops or rubs.  LUNGS: Decreased air flow in the base without retractions.  ABDOMEN: Soft, nontender, positive bowel sounds. No guard or rebound.  SKIN: Decreased skin turgor. Multiple ecchymoses.  LYMPH NODES: No cervical or supraclavicular nodes.  MUSCULOSKELETAL: No clubbing, cyanosis, edema. Tenderness over the greater trochanters bilaterally with range of motion in the legs somewhat limited secondary to pain on bilateral thighs and multiple superficial veins, without prominence.  NEUROLOGIC: Cranial nerves appear to be intact currently. No pronator drift is seen. Finger to nose appears intact. Not able to test gait secondary to leg pain.   IMPRESSION AND PLAN: 1.  Altered mental status. The patient is with evidence of falls, will get CT of head stat. Will place on IV fluids and evaluate for urinary tract infection, dehydration, and will monitor cardiac enzymes, place on telemetry, and get blood gas as well for some complaints of shortness of breath. Hip films in addition given the hip pain. Hold Aggrenox or any other blood thinners for now until his head CT is completed. Once the absence of fracture can be established, will talk about physical therapy, and potential placement.  2.  Depression. Will continue on his home medications, consideration for psychiatry consultation if no other source is found.  3.  Benign prostatic hypertrophy. Will continue the patient on Rapaflo for now. Await urinalysis.  ____________________________ Lynnea Ferrier, MD bjk:sb D: 07/16/2013 12:13:14 ET T: 07/16/2013  12:24:15 ET JOB#: [id number]  cc: Lynnea Ferrier, MD, <Dictator> Daniel Nones MD ELECTRONICALLY SIGNED 07/17/2013 7:33

## 2015-04-11 NOTE — Discharge Summary (Signed)
PATIENT NAME:  Douglas Carlson, Douglas Carlson MR#:  [id number] DATE OF BIRTH:  09-23-1925  DATE OF ADMISSION:  09/18/2013 DATE OF DISCHARGE:  09/26/2013   FINAL DIAGNOSES: 1.  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  2.  Hiatal hernia leading to #1.  3.  Transaminitis, resolved, thought to be secondary to gastric irritation and torsion.  4.  Gastroesophageal reflux disease.  5.  Severe osteoarthritis and chronic back pain.  6.  Depression.  7.  Dementia.   HISTORY AND PHYSICAL: Please see dictated admission history and physical.   SUMMARY OF HOSPITAL COURSE:  The patient was admitted with nausea, vomiting, with evidence of gastroesophageal distention on CT scan. He had evidence of transaminitis on labs. He was placed on nasogastric suction, with prompt relief of his symptoms. GI was consulted on the patient, and he underwent EGD which revealed evidence of recent partial torsion of the distal stomach. He was able to have NG tube removed and placed on liquid diet which he tolerated. An upper gastrointestinal series was performed, which did show some partial torsion of the stomach. Surgery consultation was obtained, and it was thought that he would likely need to consider surgical repair of the hiatal hernia or possible gastropexy to try to prevent recurrent symptoms. Consultation was obtained with Dr. Kandice HamsJohn Bruce, who agreed with this and recommend the patient  be transferred to a facility where this procedure could be performed and he could be monitored by a specialist familiar with this treatment. The patient underwent Myoview to evaluate cardiac status, which was negative for ischemia. His pulmonary status appears to be reasonably stable. He is likely moderate to high risk for surgical procedure due to his advanced age, arthritis and overall functional status, however, at this point he appears to be medically optimized to proceed to surgery.   Plan will be for the patient to be  transferred from this facility to a facility for Dr. Kandice HamsJohn Bruce to pursue surgery and postoperative care. At this time he remains a full liquid diet and physical activity should be up as tolerated with a walker. Follow-up will be as per his surgeons.   DISCHARGE MEDICATIONS: 1.  Oxycodone ER 15 mg p.o. b.i.d.  2.  Norco 5/325, 1 p.o. every 4 hours p.r.n. severe pain.  3.  Bupropion 75 mg p.o. daily.  4.  Remeron 15 mg p.o. at bedtime.  5.  Neurontin 200 mg p.o. b.i.d.  6.  Heparin 5000 units subcutaneous b.i.d.  7.  Dulcolax suppository 10 mg daily as needed for constipation. 8.  Pantoprazole 40 mg p.o. daily.  9.  Ondansetron 4 mg IV q. 4 hours p.r.n. nausea or vomiting.  10.  Xanax 0.25 mg p.o. b.i.d. p.r.n. anxiety or nervousness.   Of note, the patient was previously evaluated by psychiatry and due to his dementia is not felt able to make this own medical decisions. His family members, who are his powers of attorney, were in the discussions with surgery and agreed with the above.   ____________________________ Lynnea FerrierBert J. Haiden Rawlinson III, MD bjk:dp D: 09/26/2013 13:11:19 ET T: 09/26/2013 13:27:53 ET JOB#: [id number]  cc: Lynnea FerrierBert J. Yuri Flener III, MD, <Dictator> Daniel NonesBERT Andreah Goheen MD ELECTRONICALLY SIGNED 09/28/2013 8:03

## 2015-04-11 NOTE — Discharge Summary (Signed)
PATIENT NAME:  Douglas Carlson, Douglas Carlson MR#:  [id number] DATE OF BIRTH:  1925/05/21  DATE OF ADMISSION:  09/01/2013 DATE OF DISCHARGE:  09/07/2013  FINAL DIAGNOSES: 1.  Dehydration.  2.  Falls. 3.  Degenerative disk disease with chronic back pain.  4.  Depression and anxiety.  5.  Dementia.  6.  Gastroesophageal reflux disease.  7.  Anemia.  8.  History of transient ischemic attacks.  9.  Benign prostatic hypertrophy.   HISTORY AND PHYSICAL: Please see dictated admission history and physical.   SUMMARY OF HOSPITAL COURSE: The patient was admitted after being found down at home after having falls. Had dehydration as well. He has had a pattern of episodes similar to this in which he decreases his oral intake due to urinary frequency, becomes dehydrated, gets more confused, and falls. He responded rapidly to IV fluid. Medication adjustments for pain control was made, and this worked as well.   Psychiatry saw the patient, as he has benefited from psychiatric care in the past. Due to his dementia, which appears to be slightly worse, it was felt that he was not able to make his own medical decisions, an opinion which I agree with completely, as does his family. Because of his recurrent admissions for poor oral intake, falls, and the fact that he did very well in a structured environment previously, the decision was made to pursue placing him in a structured environment once again. A bed search began and a bed became available, to which he was transferred in stable condition with his physical activity up with a walker with assistance as tolerated. Diet will be regular. Physical therapy and occupational therapy to evaluate and treat the patient, and he will follow up with the nursing home physician.   DISCHARGE MEDICATIONS: 1. Aggrenox 25/200 mg 1 p.o. b.i.d.  2.  Rapaflo 4 mg p.o. daily.  3.  Oxycodone ER 15 mg p.o. b.i.d.  4.  Norco 5/325 mg 1 p.o. q. 4 hours p.r.n. pain.  5.  Bupropion 75 mg p.o. q.  a.m.  6.  Mirtazapine 15 mg p.o. at bedtime.  7.  Xanax 0.25 mg p.o. b.i.d.  8.  Multivitamin 1 p.o. daily.  9.  Neurontin 200 mg 2 capsules p.o. b.i.d. for peripheral neuropathy.  10.  Bisacodyl 10 mg suppositories rectally once a day as needed for constipation.  11.  Colace 100 mg p.o. b.i.d. as needed for constipation.  12.  MiraLax 17 grams p.o. daily as needed for constipation.  13.  Senna 1 tablet p.o. b.i.d. p.r.n. constipation.  14.  Pantoprazole 40 mg p.o. daily.  15.  Vitamin D 400 units p.o. daily.   Code status during the patient's hospitalization:  He has been FULL CODE. It is recommended that this continue to be addressed in this elderly patient with advanced arthritis and overall poor clinical long-term prognosis.  ____________________________ Lynnea FerrierBert J. Dorna Mallet III, MD bjk:sb D: 09/10/2013 08:19:09 ET T: 09/10/2013 08:31:44 ET JOB#: [id number]  cc: Lynnea FerrierBert J. Ikenna Ohms III, MD, <Dictator> Daniel NonesBERT Merl Bommarito MD ELECTRONICALLY SIGNED 09/11/2013 8:12

## 2015-04-11 NOTE — Consult Note (Signed)
   Comments   I met with pt's daughter, Lelon Frohlich. She expresses the same frustration with pt as her brother does. Family knows that pt is unsafe to return home. They are doing all they can (visiting, bringing meals, taking pt out to dinner, etc) but cannot be with him 24/7 which it seems is what the pt wants. Will explore the possibility of placement though pt will certainly resist this.  evaluation pending. Pt reportedly refused to work with PT when they saw him this AM.   Electronic Signatures: Kamaree Wheatley, Izora Gala (MD)  (Signed 29-Jul-14 12:40)  Authored: Palliative Care   Last Updated: 29-Jul-14 12:40 by Ramondo Dietze, Izora Gala (MD)

## 2015-04-11 NOTE — Consult Note (Signed)
PATIENT NAME:  Douglas Carlson, COONE MR#:  [id number] DATE OF BIRTH:  08-24-1925  DATE OF CONSULTATION:  09/23/2013  REQUESTING PHYSICIAN:  Dr. Alberteen Spindle  CONSULTING PHYSICIAN:  S.G. Evette Cristal, MD  REASON FOR CONSULTATION: Abdominal pain, hiatal hernia with gastric obstruction.   CLINICAL NOTE: This is an 81 year old male who was admitted on 09/18/2013 with a complaint of significant abdominal pain. He has a history of gastroesophageal reflux and a large hiatal hernia with the bulk of the stomach being in the chest, and has had chronic use of opioids for degenerative joint disease.  At that time, he was admitted with suspicion that he might have a gastric outlet narrowing. The patient subsequently had an NG tube placed and which was removed yesterday with resolution of his abdominal pain. He underwent endoscopy which was showing significant tortuous anatomy of the stomach, but there did not appear to be an actual obstructing point. No ulcerations or other findings were noted.   HISTORY OF PRESENT ILLNESS: As the patient at present says he was doing well up until this morning when he drank some coffee and some milk and says he started to hurt in his abdomen again. He describes no nausea or vomiting at this time, but the patient also stated that he has been kind of out of it and does not really know what all has transpired. He is not aware of previous history of problems, saying that he has never had abdominal pain until now.   PAST MEDICAL HISTORY: Includes that of the known hiatal hernia which reportedly has been asymptomatic, has symptoms of gastroesophageal reflux, chronic constipation and opioid use as mentioned above, history of depression, iron deficiency anemia, severe degenerative joint disease. He also has evidence of mild mitral regurgitation. History of TIAs, as well as BPH.  Although the patient denies this, it has been documented that he has had a history of chronic abdominal pain. His previous surgery  include cholecystectomy, appendectomy and he also had most recent surgery of inguinal hernia repair done by Dr. Renda Rolls.   PHYSICAL EXAMINATION: GENERAL: The patient is awake and alert and appears oriented but otherwise is not able to recall events that easily in the past few days.  VITAL SIGNS: Review of his vital signs show that he has been afebrile. His heart rate has been in the 60s to low 70s. Blood pressure has been stable.  HEENT: Conjunctiva is pink. Tongue is moist, pink, and clear.   NECK: Supple. No nodes or masses palpable.  LUNGS: Clear to auscultation and percussion.  HEART: Sinus rhythm.  ABDOMEN: Soft abdomen. There is no tenderness. No evidence of abdominal wall hernias. Bowel sounds are active.  EXTREMITIES: No edema or cyanosis.   LABORATORY DATA: Shows he had normal white count, hemoglobin 12.8 as of two days ago. His chemistries are essentially normal now. although he came in with a creatinine of 1.4, which is down to 1.0 now. The patient had a CT scan done, which shows largely intrathoracic stomach with air-fluid level and some distention. There is no apparent obstructive elements noted.  The rest of the abdominal evaluation is  nonrevealing.   IMPRESSION/RECOMMENDATIONS: By history, it appears that the patient might have some sort of intermittent partial obstruction of the stomach and an adequate resolution for this would certainly involve major surgery which includes dissection and mobilization of the esophagus and stomach to bring it back into the abdominal cavity and repair of the hiatal hernia. A simple alternative, if it is  felt that to be of any value, would be to consider just basic gastropexy with the stomach to be pulled down and anchored in a place where it will not twist or cause obstruction. I am not sure if this is all the problem the patient has since he has had a multitude of other symptoms in the past. I will discuss this with you in person and I will also  discuss it with Dr. Renda RollsWilton Smith, who has seen the patient in the past, and see if he has any additional suggestions. Thank you for allowing me to evaluate and help in the care of this patient.   ____________________________ S.Wynona LunaG. Joleena Weisenburger, MD sgs:sg D: 09/23/2013 09:50:29 ET T: 09/23/2013 10:20:54 ET JOB#: [id number]  cc: Timoteo ExposeS.G. Evette CristalSankar, MD, <Dictator> [facility] Wynona LunaG Kasara Schomer MD ELECTRONICALLY SIGNED 09/26/2013 8:03

## 2015-04-11 NOTE — Consult Note (Signed)
PATIENT NAME:  Douglas Carlson, Douglas Carlson [id number] OF BIRTH:  03-Jan-1925 OF ADMISSION:  09/13/2014OF CONSULTATION: 09/03/2013 PHYSICIAN: Daniel Nones, MD PRIMARY CARE PHYSICIAN:  Dr. Daniel Nones. PHYSICIAN: Kristine Linea, MD DATA: Douglas Carlson is a 81 year old male with no past psychiatric history.  COMPLAINT:  "I fell."   OF PRESENT ILLNESS:  Douglas Carlson has no psychiatric past except for cognitive decline. He was admitted for the second time after a fall. As he lives by himself, on both occasions he was found unconscious on the ground with delay. The family feels that the patient had been dehydrated. He drinks very little to avoid trips to the bathroom. He lives by himself and has not been able to manage his medications. The family feels that his home environment became unsafe and that the patient needs placement in a nursing home. The patient adamantly refuses. Unfortunately, he is unable to have any conversation about his options and insists that he is going home. He clearly does not appreciate difficulties and danger of managing his life. He denies any symptoms of depression, anxiety or psychosis. There is no history of alcohol, illicit drugs or prescription pill abuse. PSYCHIATRIC HISTORY: The family noted cognitive decline in the past few months. He has been treated for depression by Dr. Graciela Husbands. PSYCHIATRIC HISTORY: None. MEDICAL HISTORY:  GERD. Chronic constipation.  Opioid chronic use.  Iron deficiency anemia.  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  Aortic sclerosis with mitral regurgitation.  TIA.  SULFA AND MORPHINE.  ON ADMISSION:  Vitamin D3 400 units daily.  Trazodone 50 mg take 1/2 tablet every night.  Rapaflo 4 mg once a day.  Polyethylene glycol once a day.  Protonix 40 mg daily.  Oxycodone 50 mg every 12 hours.  Norco 5/325 every four hours.  Multivitamins once daily.  Mirtazapine 50 mg once daily.  Melatonin 5 mg once daily.  Gabapentin 200 mg twice daily.  Docusate 100 mg twice daily.   Bupropion 75 mg once daily.  Apresoline 0.25 twice daily.  Aggrenox 25/200 mg twice daily.  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 He lives by himself. He does not drive. He recently spent 3 weeks in a rehab facility following is last fall.  OF SYSTEMS: The patient denies any pain or discomfort.   EXAMINATION: VITAL SIGNS: Blood pressure 125/68, pulse 67, respirations 20, temperature 98.3. This is an elderly thin gentleman in no acute distress. The rest of the physical examination is deferred to his primary attending. DATA: Chemistries and LFT are within normal limits. CBC indicates severe anemia with Hg 8.9.   STATUS EXAMINATION: The patient is alert and oriented to person, place, and situation. He is pleasant, polite and cooperative. He is in bed. He is pleasant and cooperative. He is wearing a hospital gown. He maintains good eye contact. His speech is soft. Mood is "fine" with full affect. Thought process is illogical. There are no safety issues or psychosis. His cognition is impaired. His insight and judgment are poor.    I:  Cognitive disorder NOS.    Rule out delirium secondary to medical condition now resolved. II:  Deferred. III:  GERD, Constipation, iron deficiency anemia, DJD, Scoliosis, Aortic sclerosis with mitral regurgitation, BPH, Falls, TIA.  AXIS IV:  Physical illness, primary support, cognitive decline, loss of way of life.  V:  Global Assessment of Functioning 45.   1. The patient does not have the capacity to decide about discharge. 2. Please continue  Wellbutrin Remeron for depression and Trazodone for sleep. No other medications recommended.     Electronic Signatures: Kristine LineaPucilowska, Tamiko Leopard (MD)  (Signed on 16-Sep-14 23:37)  Authored  Last Updated: 16-Sep-14 23:37 by Kristine LineaPucilowska, Fariha Goto (MD)

## 2015-04-11 NOTE — Consult Note (Signed)
Chief Complaint:  Subjective/Chief Complaint seen for abdominal pain, abnormal ct.  no pain today, has ngt in place to suction.   VITAL SIGNS/ANCILLARY NOTES: **Vital Signs.:   02-Oct-14 13:32  Vital Signs Type Routine  Temperature Temperature (F) 98.7  Celsius 37  Temperature Source oral  Respirations Respirations 20  Systolic BP Systolic BP 782  Diastolic BP (mmHg) Diastolic BP (mmHg) 68  Mean BP 89  Pulse Ox % Pulse Ox % 95  Pulse Ox Activity Level  At rest  Oxygen Delivery Room Air/ 21 %   Brief Assessment:  Cardiac Regular   Respiratory clear BS   Gastrointestinal details normal Soft  Nontender  Nondistended  No masses palpable  Bowel sounds normal   Lab Results: Hepatic:  02-Oct-14 04:27   Bilirubin, Total 0.8  Alkaline Phosphatase 118  SGPT (ALT)  93  SGOT (AST)  44  Total Protein, Serum 6.4  Albumin, Serum  3.2  General Ref:  01-Oct-14 08:05   Hepatitis Panel A, B, C ========== TEST NAME ==========  ========= RESULTS =========  = REFERENCE RANGE =  HEPATITIS PANEL A, B, C  HP5+HAVIgM+HBcIgM Hep A Ab, IgM                   [   Negative             ]          Negative Hep A Ab, Total                 [   Negative             ]          Negative HBsAg Screen                    [   Negative             ]          Negative Hep B Core Ab, IgM              [   Negative             ]          Negative Hep B Core Ab, Tot              [   Negative             ]         Negative Hep B Surface Ab, Qual          [   Non Reactive         ]                                                Non Reactive: Inconsistent with immunity,                                            less than 10 mIU/mL            Reactive:     Consistent with immunity,  greater than 9.9 mIU/mL HCV Ab                          [   <0.1 s/co ratio      ]           0.0-0.9 Comment:                        [   Final Report    ]                   Non reactive HCV  antibody screen is consistent with no HCV infection, unless recent infection is suspected or other evidence exists to indicate HCV infection.               [facility]            No: [id number]          [address], [place], [place] [address]           Lindon Romp, MD         [phone]   Result(s) reported on 20 Sep 2013 at 08:47AM.  Routine Chem:  02-Oct-14 04:27   Glucose, Serum 84  BUN 10  Creatinine (comp) 1.00  Sodium, Serum 137  Potassium, Serum 3.6  Chloride, Serum 107  CO2, Serum 24  Calcium (Total), Serum 8.8  Osmolality (calc) 272  eGFR (African American) >60  eGFR (Non-African American) >60 (eGFR values <79m/min/1.73 m2 may be an indication of chronic kidney disease (CKD). Calculated eGFR is useful in patients with stable renal function. The eGFR calculation will not be reliable in acutely ill patients when serum creatinine is changing rapidly. It is not useful in  patients on dialysis. The eGFR calculation may not be applicable to patients at the low and high extremes of body sizes, pregnant women, and vegetarians.)  Anion Gap  6  Routine Hem:  02-Oct-14 04:27   WBC (CBC) 10.6  RBC (CBC)  4.04  Hemoglobin (CBC)  12.8  Hematocrit (CBC)  37.7  Platelet Count (CBC) 246  MCV 93  MCH 31.7  MCHC 34.0  RDW  14.7  Neutrophil % 73.6  Lymphocyte % 14.9  Monocyte % 7.9  Eosinophil % 2.7  Basophil % 0.9  Neutrophil #  7.8  Lymphocyte # 1.6  Monocyte # 0.8  Eosinophil # 0.3  Basophil # 0.1 (Result(s) reported on 20 Sep 2013 at 06:12AM.)   Assessment/Plan:  Assessment/Plan:  Assessment 1) abdominal pain, distension, concern for gastric outlet obstruction in the setting of abnormal ct with mostly intrathoracic stomach. improved with ngt/suction.  2) abnormal lfts-improving   Plan 1) egd tomorrow.  I have discussed the risks benefits adn complicatiosn of egd to include not limited to bleeding infection perofration and sedation and he and  daughter-in-law wish to proceed. further recs to follow.   Electronic Signatures: SLoistine Simas(MD)  (Signed 02-Oct-14 14:18)  Authored: Chief Complaint, VITAL SIGNS/ANCILLARY NOTES, Brief Assessment, Lab Results, Assessment/Plan   Last Updated: 02-Oct-14 14:18 by SLoistine Simas(MD)

## 2015-04-11 NOTE — H&P (Signed)
PATIENT NAME:  Douglas Carlson, Douglas Carlson MR#:  [id number] DATE OF BIRTH:  06/17/1925  DATE OF ADMISSION:  09/18/2013  REFERRING PHYSICIAN: Dr. Robet Leu.   PRIMARY CARE PHYSICIAN: Dr. Ramonita Lab.   CHIEF COMPLAINT: Abdominal pain.   HISTORY OF PRESENT ILLNESS: Mr. Douglas Carlson is an 81 year old Caucasian gentleman with past medical history of gastroesophageal reflux disease, large hiatal hernia, chronic constipation, chronic opioid use secondary to degenerative joint disease, as well as depression who is presenting with acute abdominal pain, presenting from [place] facility with acute epigastric pain which is nonradiating, rated 10 out of 10 in intensity which he describes only as painful. He has found no worsening or relieving factors. He said this pain started after he ate dinner with associated nausea, though denies any frank vomiting. He does not know the last time he has had a bowel movement but mentions he is still passing flatus. In the Emergency Department, he was found to have elevated LFTs as well as CAT scan finding of intrathoracic stomach which was distended, with no evidence of obstruction or perforation. Currently, he is only complaining of pain. Unable to further quantify much else, saying his pain is mid epigastric region.    REVIEW OF SYSTEMS:  CONSTITUTIONAL: Denies any fevers, fatigue, weakness.  EYES: Denies vision changes or eye pain.  ENT: Denies ear pain. Mentions extreme hard of hearing. Denies any ear discharge.  RESPIRATORY: Denies cough, wheeze, shortness of breath.  CARDIOVASCULAR: Denies chest pain, palpitations, edema.  GASTROINTESTINAL: Mentions nausea and abdominal pain as above. Denies any vomiting or diarrhea. Mentions constipation. Unknown last bowel movement.  GENITOURINARY: Denies dysuria or hematuria.  ENDOCRINE: Denies nocturia or thyroid problems.  HEMATOLOGIC AND LYMPHATIC: Denies easy bruising or bleeding.  SKIN: Denies rashes or lesions.   MUSCULOSKELETAL: Denies current neck, back, shoulder or knee pain. Denies any joint swelling.  NEUROLOGIC: Denies any paralysis, paresthesias.  PSYCHIATRIC: Denies any anxiety or depressive symptoms.   Otherwise, full review of systems performed by me is negative.   PAST MEDICAL HISTORY: Gastroesophageal reflux disease, large hiatal hernia present at least since 2007, constipation, chronic opioid usage, depression, iron deficiency anemia, severe degenerative joint disease, aortic sclerosis with mitral regurgitation, history of TIA, as well as BPH and chronic abdominal pain.   PAST SURGICAL HISTORY: Cholecystectomy, appendectomy and vasectomy.   SOCIAL HISTORY: Currently residing at [facility] facility. Prior to that, he lived alone. Denies any tobacco or alcohol usage.   FAMILY HISTORY: Mother had complications of gallbladder surgery, though he does not know any further details. Denies any family history of liver problems.   ALLERGIES: MORPHINE AND SULFA DRUGS.   HOME MEDICATIONS: Aggrenox 25/200 mg p.o. b.i.d., alprazolam 0.25 mg p.o. b.i.d., bisacodyl 10 mg rectal suppository once a day as needed for constipation, bupropion 75 mg p.o. daily, cholecalciferol 400 units p.o. daily, Colace 100 mg 2 caps daily as needed for constipation, gabapentin 200 mg p.o. b.i.d., Remeron 50 mg p.o. daily, multivitamin 1 tab p.o. daily, Norco 5/25 mg q.4 hours as needed for pain, oxycodone 15 mg extended release 1 tab orally b.i.d., pantoprazole 40 mg p.o. daily, MiraLAX 17 g orally as needed for constipation, Senna 1 tab b.i.d. as needed for constipation, silodosin 4 mg p.o. daily.   PHYSICAL EXAMINATION:  VITAL SIGNS: Heart rate 68, respirations 18, blood pressure 171/73, saturating 96% on room air. Weight 77.1 kg, BMI 25.9.  GENERAL: Somewhat cachectic gentleman who is in mild distress secondary to pain.  HEAD: Normocephalic, atraumatic.  EYES: Pupils equal, round, reactive to light.  Extraocular muscles intact. No scleral icterus. MOUTH: Dry mucosal membranes. Dentition intact. No sublingual  jaundice. No abscesses.  EARS, NOSE, THROAT: Throat is clear without exudates. No external lesions.  NECK: Supple. No thyromegaly, nodules or JVD.  PULMONARY: Clear to auscultation bilaterally without wheezes, rubs or rhonchi. No use of accessory muscles. Good respiratory effort.  CHEST: Nontender to palpation.  CARDIOVASCULAR: S1 and S2, regular rate and rhythm. No murmur, rub or gallop. No edema. Pedal pulses 2+.  GASTROINTESTINAL: Soft, mild tenderness to palpation over epigastric region. No rebound or guarding. Stomach is nondistended. No masses or hepatosplenomegaly. Hypoactive bowel sounds.  MUSCULOSKELETAL: No swelling or clubbing or edema. Range of motion full in all extremities.  NEUROLOGIC: Cranial nerves II through XII grossly intact. Sensation intact. Reflexes intact.  SKIN: No ulcerations, lesions or rashes. Skin is warm and dry. Turgor intact.  PSYCHIATRIC: Mood and affect anxious appearing. Alert, oriented x 3, though extremely difficult of hearing. Insight and judgment intact.   LABORATORY DATA: Sodium 136, potassium 4, chloride 104, bicarb 25, BUN 15, creatinine 1.04, glucose 119. Protein 7.5, albumin 3.9, bilirubin 0.5, alk phos 156, AST 174, ALT 177. WBC 8.6, hemoglobin 13.8, platelets of 290. CT of the abdomen revealing largely intrathoracic stomach which is distended. No perforation or obstruction noted.   ASSESSMENT AND PLAN: An 88-year-old gentleman with history of gastroesophageal reflux disease, large hiatal hernia, constipation, chronic opioid use, depression. Presenting with acute abdominal pain, found to have essentially a large hiatal hernia which is distended, though no evidence of obstruction or perforation.  1. Abdominal pain with distended stomach: Has a known large hiatal hernia with now superimposed ileus. No obstruction or perforation. Will place  nasogastric tube to low continuous suction. Place the patient n.p.o. Have per rectum bowel regimen. Consult gastroenterology.  2. Ileus: N.p.o., nasogastric tube insertion to low continuous suction, per rectum bowel regimen. May require enema. Will hold off for now.  3. Acute kidney injury: Likely prerenal in nature given moist mucosal membranes and decreased p.o. intake. Provide intravenous fluid hydration with normal saline. Follow renal function.  4. Transaminitis of unclear etiology: Has a history of a cholecystectomy. Will follow and trend. No evidence of liver or biliary pathology on CAT scan. May benefit from an ultrasound of this region.  5. Depression: Continue Xanax, Wellbutrin, Remeron.  6. Gastroesophageal reflux disease: Place on Protonix.  7. Venous thromboembolism prophylaxis with heparin subcutaneous.   The patient is FULL CODE.   TIME SPENT: 45 minutes.   ____________________________ David K. Hower, MD dkh:gb D: 09/18/2013 22:17:54 ET T: 09/18/2013 22:51:06 ET JOB#: [id number]  cc: David K. Hower, MD, <Dictator> DAVID K HOWER MD ELECTRONICALLY SIGNED 09/19/2013 0:43 

## 2015-04-11 NOTE — Consult Note (Signed)
Chief Complaint:  Subjective/Chief Complaint occasional nausea, no emesis, occasional upper abdominal discomfort.   VITAL SIGNS/ANCILLARY NOTES: **Vital Signs.:   06-Oct-14 13:41  Vital Signs Type Routine  Temperature Temperature (F) 98.2  Celsius 36.7  Temperature Source oral  Pulse Pulse 66  Respirations Respirations 20  Systolic BP Systolic BP 131  Diastolic BP (mmHg) Diastolic BP (mmHg) 61  Mean BP 84  Pulse Ox % Pulse Ox % 94  Pulse Ox Activity Level  At rest  Oxygen Delivery Room Air/ 21 %   Brief Assessment:  GEN well developed   Cardiac Irregular   Respiratory clear BS   Gastrointestinal details normal Soft  Nontender  No masses palpable  Bowel sounds normal   Lab Results:  Routine Chem:  06-Oct-14 04:27   Potassium, Serum 3.8 (Result(s) reported on 24 Sep 2013 at 05:07AM.)   Assessment/Plan:  Assessment/Plan:  Assessment 1) abdominal pain, improved.  Paitne with large hiatal hernia, intrathoracic stomach.  probable intermittant GOO due to anatomy.   Plan 1) awaiting possible surgical disposition.   continue ppi.  will follow at a distance.   Electronic Signatures: Barnetta ChapelSkulskie, Brendia Dampier (MD)  (Signed 06-Oct-14 16:20)  Authored: Chief Complaint, VITAL SIGNS/ANCILLARY NOTES, Brief Assessment, Lab Results, Assessment/Plan   Last Updated: 06-Oct-14 16:20 by Barnetta ChapelSkulskie, Delois Tolbert (MD)
